# Patient Record
Sex: Male | Born: 1940 | Race: White | Hispanic: Refuse to answer | Smoking: Never smoker
Health system: Southern US, Community
[De-identification: ages and names within clinical notes are randomized; demographics above are authoritative.]

## PROBLEM LIST (undated history)

## (undated) DIAGNOSIS — R519 Headache, unspecified: Secondary | ICD-10-CM

## (undated) DIAGNOSIS — Z85828 Personal history of other malignant neoplasm of skin: Secondary | ICD-10-CM

## (undated) DIAGNOSIS — M1990 Unspecified osteoarthritis, unspecified site: Secondary | ICD-10-CM

## (undated) DIAGNOSIS — C801 Malignant (primary) neoplasm, unspecified: Secondary | ICD-10-CM

## (undated) DIAGNOSIS — N400 Enlarged prostate without lower urinary tract symptoms: Secondary | ICD-10-CM

## (undated) DIAGNOSIS — G4700 Insomnia, unspecified: Secondary | ICD-10-CM

## (undated) DIAGNOSIS — G43909 Migraine, unspecified, not intractable, without status migrainosus: Secondary | ICD-10-CM

## (undated) DIAGNOSIS — F418 Other specified anxiety disorders: Secondary | ICD-10-CM

## (undated) DIAGNOSIS — I454 Nonspecific intraventricular block: Secondary | ICD-10-CM

## (undated) DIAGNOSIS — E785 Hyperlipidemia, unspecified: Secondary | ICD-10-CM

## (undated) DIAGNOSIS — K5730 Diverticulosis of large intestine without perforation or abscess without bleeding: Secondary | ICD-10-CM

## (undated) DIAGNOSIS — R51 Headache: Secondary | ICD-10-CM

## (undated) DIAGNOSIS — F325 Major depressive disorder, single episode, in full remission: Secondary | ICD-10-CM

## (undated) DIAGNOSIS — F329 Major depressive disorder, single episode, unspecified: Secondary | ICD-10-CM

## (undated) DIAGNOSIS — H269 Unspecified cataract: Secondary | ICD-10-CM

## (undated) DIAGNOSIS — K4090 Unilateral inguinal hernia, without obstruction or gangrene, not specified as recurrent: Secondary | ICD-10-CM

## (undated) DIAGNOSIS — K219 Gastro-esophageal reflux disease without esophagitis: Secondary | ICD-10-CM

## (undated) DIAGNOSIS — K2100 Gastro-esophageal reflux disease with esophagitis, without bleeding: Secondary | ICD-10-CM

## (undated) DIAGNOSIS — I10 Essential (primary) hypertension: Secondary | ICD-10-CM

## (undated) DIAGNOSIS — N200 Calculus of kidney: Secondary | ICD-10-CM

## (undated) DIAGNOSIS — F419 Anxiety disorder, unspecified: Secondary | ICD-10-CM

## (undated) DIAGNOSIS — T7840XA Allergy, unspecified, initial encounter: Secondary | ICD-10-CM

## (undated) DIAGNOSIS — N401 Enlarged prostate with lower urinary tract symptoms: Secondary | ICD-10-CM

## (undated) HISTORY — DX: Calculus of kidney: N20.0

## (undated) HISTORY — DX: Insomnia, unspecified: G47.00

## (undated) HISTORY — DX: Unilateral inguinal hernia, without obstruction or gangrene, not specified as recurrent: K40.90

## (undated) HISTORY — DX: Allergy, unspecified, initial encounter: T78.40XA

## (undated) HISTORY — DX: Malignant (primary) neoplasm, unspecified: C80.1

## (undated) HISTORY — DX: Other specified anxiety disorders: F41.8

## (undated) HISTORY — DX: Diverticulosis of large intestine without perforation or abscess without bleeding: K57.30

## (undated) HISTORY — DX: Nonspecific intraventricular block: I45.4

## (undated) HISTORY — DX: Gastro-esophageal reflux disease without esophagitis: K21.9

## (undated) HISTORY — DX: Headache: R51

## (undated) HISTORY — PX: COLONOSCOPY W/ POLYPECTOMY: SHX1380

## (undated) HISTORY — PX: WISDOM TOOTH EXTRACTION: SHX21

## (undated) HISTORY — DX: Essential (primary) hypertension: I10

## (undated) HISTORY — DX: Headache, unspecified: R51.9

## (undated) HISTORY — DX: Personal history of other malignant neoplasm of skin: Z85.828

## (undated) HISTORY — DX: Unspecified cataract: H26.9

## (undated) HISTORY — PX: TONSILLECTOMY: SUR1361

## (undated) HISTORY — DX: Anxiety disorder, unspecified: F41.9

## (undated) HISTORY — DX: Migraine, unspecified, not intractable, without status migrainosus: G43.909

## (undated) HISTORY — DX: Major depressive disorder, single episode, unspecified: F32.9

## (undated) HISTORY — DX: Gastro-esophageal reflux disease with esophagitis, without bleeding: K21.00

## (undated) HISTORY — DX: Benign prostatic hyperplasia with lower urinary tract symptoms: N40.1

## (undated) HISTORY — PX: HERNIA REPAIR: SHX51

## (undated) HISTORY — DX: Hyperlipidemia, unspecified: E78.5

## (undated) HISTORY — PX: MOHS SURGERY: SUR867

## (undated) HISTORY — PX: INGUINAL HERNIA REPAIR: SUR1180

## (undated) HISTORY — DX: Unspecified osteoarthritis, unspecified site: M19.90

## (undated) HISTORY — DX: Benign prostatic hyperplasia without lower urinary tract symptoms: N40.0

## (undated) HISTORY — DX: Major depressive disorder, single episode, in full remission: F32.5

---

## 2014-09-12 HISTORY — PX: COLONOSCOPY W/ POLYPECTOMY: SHX1380

## 2016-03-14 HISTORY — PX: MOHS SURGERY: SUR867

## 2016-06-21 ENCOUNTER — Encounter: Payer: Self-pay | Admitting: *Deleted

## 2016-07-07 ENCOUNTER — Ambulatory Visit (INDEPENDENT_AMBULATORY_CARE_PROVIDER_SITE_OTHER): Payer: Medicare Other | Admitting: Cardiology

## 2016-07-07 ENCOUNTER — Encounter (INDEPENDENT_AMBULATORY_CARE_PROVIDER_SITE_OTHER): Payer: Self-pay

## 2016-07-07 ENCOUNTER — Encounter: Payer: Self-pay | Admitting: Cardiology

## 2016-07-07 VITALS — BP 122/80 | HR 84 | Ht 72.0 in | Wt 173.5 lb

## 2016-07-07 DIAGNOSIS — I454 Nonspecific intraventricular block: Secondary | ICD-10-CM

## 2016-07-07 DIAGNOSIS — R011 Cardiac murmur, unspecified: Secondary | ICD-10-CM

## 2016-07-07 DIAGNOSIS — E7800 Pure hypercholesterolemia, unspecified: Secondary | ICD-10-CM | POA: Diagnosis not present

## 2016-07-07 NOTE — Progress Notes (Signed)
Cardiology Office Note:    Date:  07/07/2016   ID:  Peter Best, DOB Jun 29, 1940, MRN [id number]  PCP:  Pcp Not In System  Cardiologist:  Donato Schultz, MD     Referring MD: No ref. provider found    History of Present Illness:    Peter Best is a 76 y.o. male here as a self-referral because he wanted to be evaluated by cardiologist. I have reviewed his notes from Dr. Allena Katz internal medicine. He has been doing quite well. Has anxiety, hyperlipidemia, hypertensive disorder. Occasionally has blood pressure will be a little bit higher than he would like. He has been on Zoloft in the past for anxiety. He has been on atorvastatin 20 mg and lisinopril 10 mg as well as aspirin.  At times when he checks his blood pressures at home, his machine may register irregular heartbeat. Sometimes he may feel something slightly different but usually he does not feel any significant palpitations.  Rheumatic fever as a child.   Peter Best here today.   He worked in Radio producer previously. Retired. Enjoying [employer] [facility].  Past Medical History:  Diagnosis Date  . Anxiety disorder   . Benign prostatic hyperplasia   . Gastroesophageal reflux disease   . Hyperlipidemia   . Hypertensive disorder   . Inguinal hernia   . Insomnia   . Major depressive disorder   . Osteoarthritis    OF THE HIP    Past Surgical History:  Procedure Laterality Date  . COLONOSCOPY W/ POLYPECTOMY    . COLONOSCOPY W/ POLYPECTOMY     UNCODED SURGICAL HISTORY  . HERNIA REPAIR    . INGUINAL HERNIA REPAIR    . MOHS SURGERY    . MOHS SURGERY     MOHS' CHEMOSURGERY PROCEDURE DATE: 09/30/2010:BCC R INFERIOR TEMPLE, UNCODED SURGICAL HISTORY    Current Medications: Current Meds  Medication Sig  . atorvastatin (LIPITOR) 20 MG tablet Take 20 mg by mouth daily.  Marland Kitchen lisinopril (PRINIVIL,ZESTRIL) 20 MG tablet Take 20 mg by mouth daily.  Marland Kitchen omeprazole (PRILOSEC) 20 MG capsule Take 20 mg by mouth daily.     Allergies:   Patient has no allergy information on record.   Social History   Social History  . Marital status: Single    Spouse name: N/A  . Number of children: N/A  . Years of education: N/A   Social History Main Topics  . Smoking status: Not on file  . Smokeless tobacco: Not on file  . Alcohol use Not on file  . Drug use: No  . Sexual activity: Not on file   Other Topics Concern  . Not on file   Social History Narrative  . No narrative on file     No early family history of coronary artery disease. Mother died CVA later in life.   ROS:   Please see the history of present illness.   Denies syncope, bleeding, orthopnea, PND.  All other systems reviewed and are negative.   EKGs/Labs/Other Studies Reviewed:    EKG:  EKG is  ordered today.  The ekg ordered today demonstrates 07/07/16-sinus rhythm 84 with right bundle branch block personally viewed.  Recent Labs: No results found for requested labs within last 8760 hours.   Recent Lipid Panel No results found for: CHOL, TRIG, HDL, CHOLHDL, VLDL, LDLCALC, LDLDIRECT  Physical Exam:    VS:  BP 122/80   Pulse 84   Ht 6' (1.829 m)   Wt 173 lb 8 oz (78.7 kg)  SpO2 97%   BMI 23.53 kg/m     Wt Readings from Last 3 Encounters:  07/07/16 173 lb 8 oz (78.7 kg)     GEN:  Well nourished, well developed in no acute distress HEENT: Normal NECK: No JVD; No carotid bruits LYMPHATICS: No lymphadenopathy CARDIAC: RRR, 1/6 systolic murmur right upper sternal border, no rubs, gallops RESPIRATORY:  Clear to auscultation without rales, wheezing or rhonchi  ABDOMEN: Soft, non-tender, non-distended MUSCULOSKELETAL:  No edema; No deformity  SKIN: Warm and dry, excellent distal pulses. NEUROLOGIC:  Alert and oriented x 3 PSYCHIATRIC:  Normal affect   ASSESSMENT:    1. Murmur, cardiac   2. BBB (bundle branch block)   3. Pure hypercholesterolemia    PLAN:    In order of problems listed above:  Right bundle branch  block  - Stable, no high-risk symptoms such as syncope.  Possible irregular heartbeat  - Sometimes seen on blood pressure monitor at home. This may be artifactual. He does not feel any significant palpitations. No other high-risk symptoms.  Hypertension  - On lisinopril 20 mg. Doing well. No changes made. Excellent control by Dr. Allena Katz.  Hyperlipidemia  - On atorvastatin 20 mg doing well. No myalgias.  Heart murmur  - With his history of rheumatic fever as a child and soft systolic heart murmur at right upper sternal border, I will check an echocardiogram.  We will send note to Dr. Allena Katz, telephone number [phone], located in [place].  Medication Adjustments/Labs and Tests Ordered: Current medicines are reviewed at length with the patient today.  Concerns regarding medicines are outlined above. Labs and tests ordered and medication changes are outlined in the patient instructions below:  Patient Instructions  Medication Instructions:  The current medical regimen is effective;  continue present plan and medications.  Testing/Procedures: Your physician has requested that you have an echocardiogram. Echocardiography is a painless test that uses sound waves to create images of your heart. It provides your doctor with information about the size and shape of your heart and how well your heart's chambers and valves are working. This procedure takes approximately one hour. There are no restrictions for this procedure.  Follow-Up: Follow up in 2 years with Dr. Anne Fu.  You will receive a letter in the mail 2 months before you are due.  Please call us when you receive this letter to schedule your follow up appointment.  If you need a refill on your cardiac medications before your next appointment, please call your pharmacy.  Thank you for choosing [facility]!!        Signed, Donato Schultz, MD  07/07/2016 11:03 AM    [facility]

## 2016-07-07 NOTE — Patient Instructions (Signed)
Medication Instructions:  The current medical regimen is effective;  continue present plan and medications.  Testing/Procedures: Your physician has requested that you have an echocardiogram. Echocardiography is a painless test that uses sound waves to create images of your heart. It provides your doctor with information about the size and shape of your heart and how well your heart's chambers and valves are working. This procedure takes approximately one hour. There are no restrictions for this procedure.  Follow-Up: Follow up in 2 years with Dr. Marlou Porch.  You will receive a letter in the mail 2 months before you are due.  Please call us when you receive this letter to schedule your follow up appointment.  If you need a refill on your cardiac medications before your next appointment, please call your pharmacy.  Thank you for choosing [facility]!!

## 2016-07-25 ENCOUNTER — Ambulatory Visit (HOSPITAL_COMMUNITY): Payer: Medicare Other | Attending: Cardiology

## 2016-07-25 ENCOUNTER — Other Ambulatory Visit: Payer: Self-pay

## 2016-07-25 DIAGNOSIS — I454 Nonspecific intraventricular block: Secondary | ICD-10-CM | POA: Diagnosis not present

## 2016-07-25 DIAGNOSIS — I351 Nonrheumatic aortic (valve) insufficiency: Secondary | ICD-10-CM | POA: Insufficient documentation

## 2016-07-25 DIAGNOSIS — R011 Cardiac murmur, unspecified: Secondary | ICD-10-CM | POA: Diagnosis present

## 2016-07-25 LAB — ECHOCARDIOGRAM COMPLETE
Ao-asc: 35 cm
CHL CUP LV S' LATERAL: 11.5 cm/s
CHL CUP MV DEC (S): 194
CHL CUP STROKE VOLUME: 56 mL
CHL CUP TV REG PEAK VELOCITY: 204 cm/s
E/e' ratio: 9.42
EWDT: 194 ms
FS: 31 % (ref 28–44)
IV/PV OW: 1.02
LA diam end sys: 36 mm
LA diam index: 1.79 cm/m2
LA vol: 49 mL
LASIZE: 36 mm
LAVOLA4C: 48 mL
LAVOLIN: 24.4 mL/m2
LDCA: 3.46 cm2
LV PW d: 12.1 mm — AB (ref 0.6–1.1)
LV TDI E'MEDIAL: 5.46
LV dias vol index: 41 mL/m2
LV e' LATERAL: 7.12 cm/s
LV sys vol: 27 mL (ref 21–61)
LVDIAVOL: 83 mL (ref 62–150)
LVEEAVG: 9.42
LVEEMED: 9.42
LVOT VTI: 20.7 cm
LVOT peak grad rest: 5 mmHg
LVOTD: 21 mm
LVOTPV: 111 cm/s
LVOTSV: 72 mL
LVSYSVOLIN: 13 mL/m2
MV pk A vel: 80 m/s
MV pk E vel: 67.1 m/s
P 1/2 time: 513 ms
Simpson's disk: 67
TDI e' lateral: 7.12
TRMAXVEL: 204 cm/s

## 2017-07-19 DIAGNOSIS — M25562 Pain in left knee: Secondary | ICD-10-CM | POA: Insufficient documentation

## 2017-07-19 DIAGNOSIS — M79652 Pain in left thigh: Secondary | ICD-10-CM

## 2017-07-19 HISTORY — DX: Pain in left knee: M25.562

## 2017-07-19 HISTORY — DX: Pain in left thigh: M79.652

## 2017-10-05 ENCOUNTER — Telehealth: Payer: Self-pay | Admitting: Internal Medicine

## 2017-10-26 ENCOUNTER — Telehealth: Payer: Self-pay | Admitting: Internal Medicine

## 2017-10-26 NOTE — Telephone Encounter (Signed)
Outside records from [place] reviewed. Colonoscopy performed 09/25/2014. The patient had one 1,1 cm flat polyp at the ileocecal valve and 5 mm cecal polyp removed. Found to be adenomatous. Follow-up in 3 years recommended. Okay to schedule for colonoscopy with me in the [facility]

## 2017-10-27 ENCOUNTER — Encounter: Payer: Self-pay | Admitting: Internal Medicine

## 2017-11-07 NOTE — Telephone Encounter (Signed)
Pt is scheduled for 01-02-18 for a colon with Dr. Henrene Pastor

## 2017-12-07 ENCOUNTER — Encounter: Payer: Self-pay | Admitting: Internal Medicine

## 2017-12-14 ENCOUNTER — Ambulatory Visit (AMBULATORY_SURGERY_CENTER): Payer: Self-pay | Admitting: *Deleted

## 2017-12-14 ENCOUNTER — Other Ambulatory Visit: Payer: Self-pay

## 2017-12-14 VITALS — Ht 69.0 in | Wt 172.0 lb

## 2017-12-14 DIAGNOSIS — Z86010 Personal history of colonic polyps: Secondary | ICD-10-CM

## 2017-12-14 MED ORDER — SUPREP BOWEL PREP KIT 17.5-3.13-1.6 GM/177ML PO SOLN: 1.0000 | Freq: Once | ORAL | 0 refills | Status: AC

## 2017-12-14 NOTE — Progress Notes (Signed)
Patient denies any allergies to egg or soy products. Patient denies complications with anesthesia/sedation.  Patient denies oxygen use at home and denies diet medications. Pamphlet given to patient on colonoscopy.

## 2017-12-21 ENCOUNTER — Encounter: Payer: Medicare Other | Admitting: Internal Medicine

## 2018-01-01 DIAGNOSIS — H2513 Age-related nuclear cataract, bilateral: Secondary | ICD-10-CM

## 2018-01-01 HISTORY — DX: Age-related nuclear cataract, bilateral: H25.13

## 2018-01-02 ENCOUNTER — Encounter: Payer: Self-pay | Admitting: Internal Medicine

## 2018-01-02 ENCOUNTER — Ambulatory Visit (AMBULATORY_SURGERY_CENTER): Payer: Medicare Other | Admitting: Internal Medicine

## 2018-01-02 VITALS — BP 122/62 | HR 83 | Temp 97.8°F | Resp 17 | Ht 69.0 in | Wt 172.0 lb

## 2018-01-02 DIAGNOSIS — Z86010 Personal history of colonic polyps: Secondary | ICD-10-CM

## 2018-01-02 MED ORDER — SODIUM CHLORIDE 0.9 % IV SOLN: 500.0000 mL | Freq: Once | INTRAVENOUS | Status: AC

## 2018-01-02 NOTE — Patient Instructions (Signed)
YOU HAD AN ENDOSCOPIC PROCEDURE TODAY AT THE [facility]:   Refer to the procedure report that was given to you for any specific questions about what was found during the examination.  If the procedure report does not answer your questions, please call your gastroenterologist to clarify.  If you requested that your care partner not be given the details of your procedure findings, then the procedure report has been included in a sealed envelope for you to review at your convenience later.  YOU SHOULD EXPECT: Some feelings of bloating in the abdomen. Passage of more gas than usual.  Walking can help get rid of the air that was put into your GI tract during the procedure and reduce the bloating. If you had a lower endoscopy (such as a colonoscopy or flexible sigmoidoscopy) you may notice spotting of blood in your stool or on the toilet paper. If you underwent a bowel prep for your procedure, you may not have a normal bowel movement for a few days.  Please Note:  You might notice some irritation and congestion in your nose or some drainage.  This is from the oxygen used during your procedure.  There is no need for concern and it should clear up in a day or so.  SYMPTOMS TO REPORT IMMEDIATELY:   Following lower endoscopy (colonoscopy or flexible sigmoidoscopy):  Excessive amounts of blood in the stool  Significant tenderness or worsening of abdominal pains  Swelling of the abdomen that is new, acute  Fever of 100F or higher  For urgent or emergent issues, a gastroenterologist can be reached at any hour by calling [phone].   DIET:  We do recommend a small meal at first, but then you may proceed to your regular diet.  Drink plenty of fluids but you should avoid alcoholic beverages for 24 hours.  MEDICATIONS: Continue present medications.  Please see handouts given to you by your recovery nurse.  ACTIVITY:  You should plan to take it easy for the rest of today and you should NOT  DRIVE or use heavy machinery until tomorrow (because of the sedation medicines used during the test).    FOLLOW UP: Our staff will call the number listed on your records the next business day following your procedure to check on you and address any questions or concerns that you may have regarding the information given to you following your procedure. If we do not reach you, we will leave a message.  However, if you are feeling well and you are not experiencing any problems, there is no need to return our call.  We will assume that you have returned to your regular daily activities without incident.  If any biopsies were taken you will be contacted by phone or by letter within the next 1-3 weeks.  Please call us at [phone] if you have not heard about the biopsies in 3 weeks.   Thank you for allowing us to provide for your healthcare needs today.   SIGNATURES/CONFIDENTIALITY: You and/or your care partner have signed paperwork which will be entered into your electronic medical record.  These signatures attest to the fact that that the information above on your After Visit Summary has been reviewed and is understood.  Full responsibility of the confidentiality of this discharge information lies with you and/or your care-partner. 

## 2018-01-02 NOTE — Op Note (Signed)
[facility] Patient Name: Peter Best Procedure Date: 01/02/2018 4:20 PM MRN: [id number] Endoscopist: Wilhemina Bonito. Marina Goodell , MD Age: 77 Referring MD:  Date of Birth: 18-Aug-1940 Gender: Male Account #: [id number] Procedure:                Colonoscopy Indications:              High risk colon cancer surveillance: Personal                            history of adenoma (10 mm or greater in size).                            Prior exam in [place] September 25, 2014 Medicines:                Monitored Anesthesia Care Procedure:                Pre-Anesthesia Assessment:                           - Prior to the procedure, a History and Physical                            was performed, and patient medications and                            allergies were reviewed. The patient's tolerance of                            previous anesthesia was also reviewed. The risks                            and benefits of the procedure and the sedation                            options and risks were discussed with the patient.                            All questions were answered, and informed consent                            was obtained. Prior Anticoagulants: The patient has                            taken no previous anticoagulant or antiplatelet                            agents. ASA Grade Assessment: II - A patient with                            mild systemic disease. After reviewing the risks                            and benefits, the patient was deemed in  satisfactory condition to undergo the procedure.                           After obtaining informed consent, the colonoscope                            was passed under direct vision. Throughout the                            procedure, the patient's blood pressure, pulse, and                            oxygen saturations were monitored continuously. The                            Colonoscope was introduced  through the anus and                            advanced to the the cecum, identified by                            appendiceal orifice and ileocecal valve. The                            terminal ileum, ileocecal valve, appendiceal                            orifice, and rectum were photographed. The quality                            of the bowel preparation was excellent. The                            colonoscopy was performed without difficulty. The                            patient tolerated the procedure well. The bowel                            preparation used was SUPREP. Scope In: 4:33:49 PM Scope Out: 4:49:14 PM Scope Withdrawal Time: 0 hours 11 minutes 51 seconds  Total Procedure Duration: 0 hours 15 minutes 25 seconds  Findings:                 Multiple diverticula were found in the entire colon.                           Internal hemorrhoids were found during                            retroflexion. The hemorrhoids were small.                           The exam was otherwise without abnormality on  direct and retroflexion views. Complications:            No immediate complications. Estimated blood loss:                            None. Estimated Blood Loss:     Estimated blood loss: none. Impression:               - Diverticulosis in the entire examined colon.                           - Internal hemorrhoids.                           - The examination was otherwise normal on direct                            and retroflexion views.                           - No specimens collected. Recommendation:           - Repeat colonoscopy is not recommended for                            surveillance.                           - Patient has a contact number available for                            emergencies. The signs and symptoms of potential                            delayed complications were discussed with the                            patient.  Return to normal activities tomorrow.                            Written discharge instructions were provided to the                            patient.                           - Resume previous diet.                           - Continue present medications. Wilhemina Bonito. Marina Goodell, MD 01/02/2018 4:54:03 PM This report has been signed electronically.

## 2018-01-02 NOTE — Progress Notes (Signed)
Pt's states no medical or surgical changes since previsit or office visit. 

## 2018-01-02 NOTE — Progress Notes (Signed)
A/ox3 pleased with MAC, report to RN 

## 2018-01-03 ENCOUNTER — Telehealth: Payer: Self-pay | Admitting: *Deleted

## 2018-01-03 NOTE — Telephone Encounter (Signed)
  Follow up Call-  Call back number 01/02/2018  Post procedure Call Back phone  # [phone]  Permission to leave phone message Yes     Patient questions:  Do you have a fever, pain , or abdominal swelling? No. Pain Score  0 *  Have you tolerated food without any problems? Yes.    Have you been able to return to your normal activities? Yes.    Do you have any questions about your discharge instructions: Diet   No. Medications  No. Follow up visit  No.  Do you have questions or concerns about your Care? No.  Actions: * If pain score is 4 or above: No action needed, pain <4.

## 2018-03-23 NOTE — Telephone Encounter (Signed)
Done

## 2018-04-10 DIAGNOSIS — R69 Illness, unspecified: Secondary | ICD-10-CM | POA: Diagnosis not present

## 2018-04-18 DIAGNOSIS — Z823 Family history of stroke: Secondary | ICD-10-CM | POA: Diagnosis not present

## 2018-04-18 DIAGNOSIS — M542 Cervicalgia: Secondary | ICD-10-CM | POA: Diagnosis not present

## 2018-04-18 DIAGNOSIS — R69 Illness, unspecified: Secondary | ICD-10-CM | POA: Diagnosis not present

## 2018-04-18 DIAGNOSIS — R51 Headache: Secondary | ICD-10-CM | POA: Diagnosis not present

## 2018-04-18 DIAGNOSIS — K219 Gastro-esophageal reflux disease without esophagitis: Secondary | ICD-10-CM | POA: Diagnosis not present

## 2018-04-18 DIAGNOSIS — J309 Allergic rhinitis, unspecified: Secondary | ICD-10-CM | POA: Diagnosis not present

## 2018-04-18 DIAGNOSIS — I10 Essential (primary) hypertension: Secondary | ICD-10-CM | POA: Diagnosis not present

## 2018-04-18 DIAGNOSIS — E785 Hyperlipidemia, unspecified: Secondary | ICD-10-CM | POA: Diagnosis not present

## 2018-04-18 DIAGNOSIS — Z7951 Long term (current) use of inhaled steroids: Secondary | ICD-10-CM | POA: Diagnosis not present

## 2018-06-18 ENCOUNTER — Telehealth: Payer: Self-pay | Admitting: *Deleted

## 2018-06-18 NOTE — Telephone Encounter (Signed)
Left message for pt to c/b to discuss the upcoming appointment.  Need to determine if he is comfortable with a virtual visit through [employer] and if so send information and instructions through [place].

## 2018-06-19 NOTE — Telephone Encounter (Signed)
Spoke with patient who does not have a smart phone and prefers to reschedule to a later date.  He is not having any cardiac issues and just wants to reestablish.  appt rescheduled until 7/13.  He is aware to c/b if problems prior to then.

## 2018-06-27 ENCOUNTER — Ambulatory Visit: Payer: Medicare HMO | Admitting: Cardiology

## 2018-09-13 ENCOUNTER — Telehealth: Payer: Self-pay

## 2018-09-13 NOTE — Telephone Encounter (Signed)
lpmtcb 7/2

## 2018-09-13 NOTE — Telephone Encounter (Signed)
Patient returned your call.

## 2018-09-13 NOTE — Telephone Encounter (Signed)
I spoke to the patient who is concerned about his elevating BP since the start of the Virus and lock down at his facility.  He has been under a lot of stress and his BP has been fluctuating, sent through My Chart.  He has an occasional headache, but otherwise asymptomatic.  He looks forward to further advisement at his OV 7/13 with Dr Marlou Porch.

## 2018-09-18 DIAGNOSIS — R69 Illness, unspecified: Secondary | ICD-10-CM | POA: Diagnosis not present

## 2018-09-21 ENCOUNTER — Encounter: Payer: Self-pay | Admitting: Cardiology

## 2018-09-21 ENCOUNTER — Telehealth: Payer: Self-pay | Admitting: Cardiology

## 2018-09-21 ENCOUNTER — Telehealth: Payer: Self-pay

## 2018-09-21 NOTE — Telephone Encounter (Signed)
Left VM to call back to confirm his Phone call appointment.

## 2018-09-21 NOTE — Telephone Encounter (Signed)
New Message    Left message to  Confirm appt and answer COVID questions

## 2018-09-21 NOTE — Telephone Encounter (Signed)

## 2018-09-24 ENCOUNTER — Ambulatory Visit: Payer: Medicare HMO | Admitting: Cardiology

## 2018-09-24 NOTE — Progress Notes (Deleted)
{Choose 1 Note Type (Telehealth Visit or Telephone Visit):[phone]}   Date:  09/24/2018   ID:  Peter Best, DOB [birth year], MRN [id number]  {Patient Location:[phone]::"Home"} {Provider Location:[phone]::"Home"}  PCP:  System, Pcp Not In  Cardiologist:  No primary care provider on file. *** Electrophysiologist:  None   Evaluation Performed:  {Choose Visit Type:[phone]::"Follow-Up Visit"}  Chief Complaint: Here for follow-up, hyperlipidemia  History of Present Illness:    Peter Best is a 78 y.o. male with prior office visit 07/07/2016, self-referral wanting to be evaluated.  Doing well.  Had anxiety hyperlipidemia hypertension occasionally blood pressure little bit higher than what he would want to be.  Zoloft in the past for anxiety.  Atorvastatin 20 mg and lisinopril 10 mg for blood pressure.  Sometimes he may feel some palpitations, blood pressure cuff may register irregular heartbeats previously at home he documented.  States that he had rheumatic fever as a child.  Used to work in radio and television previously.  Retired.  The patient {does/does not:200015} have symptoms concerning for COVID-19 infection (fever, chills, cough, or new shortness of breath).    Past Medical History:  Diagnosis Date  . Allergy   . Anxiety disorder   . BBB (bundle branch block)   . Benign prostatic hyperplasia   . 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 Cataract    just watching - bilateral  . Gastroesophageal reflux disease   . HA (headache)    otc med prn  . Hyperlipidemia   . Hypertensive disorder   . Inguinal hernia    repaired by surgery  . Insomnia   . Kidney stones    passed stone - no surgery required  . Major depressive disorder   . Osteoarthritis    OF THE HIP and mid back   Past Surgical History:  Procedure Laterality Date  . COLONOSCOPY W/ POLYPECTOMY  09/2014   polyps  - done in [place]  . COLONOSCOPY W/ POLYPECTOMY     UNCODED SURGICAL HISTORY  .  HERNIA REPAIR    . INGUINAL HERNIA REPAIR    . MOHS SURGERY  2018   x x  on nose  . MOHS SURGERY     MOHS' CHEMOSURGERY PROCEDURE DATE: 09/30/2010:BCC R INFERIOR TEMPLE, UNCODED SURGICAL HISTORY  . TONSILLECTOMY    . WISDOM TOOTH EXTRACTION       No outpatient medications have been marked as taking for the 09/24/18 encounter (Appointment) with Jake Bathe, MD.   Current Facility-Administered Medications for the 09/24/18 encounter (Appointment) with Jake Bathe, MD  Medication  . 0.9 %  sodium chloride infusion     Allergies:   Patient has no known allergies.   Social History   Tobacco Use  . Smoking status: Never Smoker  . Smokeless tobacco: Never Used  Substance Use Topics  . Alcohol use: Never    Frequency: Never  . Drug use: No     Family Hx: The patient's family history is negative for Colon polyps, Rectal cancer, and Stomach cancer.  Mother died of a stroke later in her life.  No early family history of CAD  ROS:   Please see the history of present illness.    *** All other systems reviewed and are negative.   Prior CV studies:   The following studies were reviewed today:  Echocardiogram 07/25/2016: - Left ventricle: The cavity size was normal. There was mild   concentric hypertrophy. Systolic function was normal. The  estimated ejection fraction was in the range of 55% to 60%. Wall   motion was normal; there were no regional wall motion   abnormalities. Doppler parameters are consistent with abnormal   left ventricular relaxation (grade 1 diastolic dysfunction).   There was no evidence of elevated ventricular filling pressure by   Doppler parameters. - Aortic valve: Trileaflet; moderately thickened, moderately   calcified leaflets. There was mild regurgitation. - Aortic root: The aortic root was normal in size. - Mitral valve: There was no regurgitation. - Tricuspid valve: There was no regurgitation. - Pulmonic valve: There was mild regurgitation. -  Pulmonary arteries: Systolic pressure was within the normal   range. - Inferior vena cava: The vessel was normal in size. - Pericardium, extracardiac: There was no pericardial effusion.  Labs/Other Tests and Data Reviewed:    EKG:  {EKG/Telemetry Strips Reviewed:[phone]} EKG on 07/07/2016 showed sinus rhythm 84 with right bundle branch block.  Recent Labs: No results found for requested labs within last 8760 hours.   Recent Lipid Panel No results found for: CHOL, TRIG, HDL, CHOLHDL, LDLCALC, LDLDIRECT  Wt Readings from Last 3 Encounters:  01/02/18 172 lb (78 kg)  12/14/17 172 lb (78 kg)  07/07/16 173 lb 8 oz (78.7 kg)     Objective:    Vital Signs:  There were no vitals taken for this visit.   {[facility] Virtual Exam (Optional):[phone]::"VITAL SIGNS:  reviewed"}  ASSESSMENT & PLAN:    Right bundle branch block - Asymptomatic.  Should be of no clinical consequence.  No high risk symptoms such as syncope.  Essential hypertension -On lisinopril 20.  Doing well.  Hyperlipidemia -Atorvastatin 20.  Doing well without any myalgias.  Previous heart murmur -Soft systolic heart murmur-echocardiogram actually showed some thickening of the aortic valve with mild aortic regurgitation.  COVID-19 Education: The signs and symptoms of COVID-19 were discussed with the patient and how to seek care for testing (follow up with PCP or arrange E-visit).  ***The importance of social distancing was discussed today.  Time:   Today, I have spent *** minutes with the patient with telehealth technology discussing the above problems.     Medication Adjustments/Labs and Tests Ordered: Current medicines are reviewed at length with the patient today.  Concerns regarding medicines are outlined above.   Tests Ordered: No orders of the defined types were placed in this encounter.   Medication Changes: No orders of the defined types were placed in this encounter.   Follow Up:  {F/U  Format:[phone]} {follow up:15908}  Signed, Donato Schultz, MD  09/24/2018 8:37 AM    [facility]

## 2018-09-28 ENCOUNTER — Telehealth: Payer: Self-pay | Admitting: Cardiology

## 2018-09-28 NOTE — Telephone Encounter (Signed)
New Message ° ° ° °Left message to confirm appt and answer covid questions  °

## 2018-09-28 NOTE — Telephone Encounter (Signed)

## 2018-10-01 ENCOUNTER — Encounter: Payer: Self-pay | Admitting: Cardiology

## 2018-10-01 ENCOUNTER — Other Ambulatory Visit: Payer: Self-pay

## 2018-10-01 ENCOUNTER — Ambulatory Visit (INDEPENDENT_AMBULATORY_CARE_PROVIDER_SITE_OTHER): Payer: Medicare HMO | Admitting: Cardiology

## 2018-10-01 VITALS — BP 120/80 | HR 80 | Ht 69.0 in | Wt 172.4 lb

## 2018-10-01 DIAGNOSIS — I4510 Unspecified right bundle-branch block: Secondary | ICD-10-CM | POA: Diagnosis not present

## 2018-10-01 DIAGNOSIS — E7800 Pure hypercholesterolemia, unspecified: Secondary | ICD-10-CM | POA: Insufficient documentation

## 2018-10-01 DIAGNOSIS — E781 Pure hyperglyceridemia: Secondary | ICD-10-CM

## 2018-10-01 DIAGNOSIS — I10 Essential (primary) hypertension: Secondary | ICD-10-CM | POA: Insufficient documentation

## 2018-10-01 HISTORY — DX: Pure hypercholesterolemia, unspecified: E78.00

## 2018-10-01 HISTORY — DX: Essential (primary) hypertension: I10

## 2018-10-01 HISTORY — DX: Unspecified right bundle-branch block: I45.10

## 2018-10-01 NOTE — Progress Notes (Signed)
Cardiology Office Note:    Date:  10/01/2018   ID:  Peter Best, DOB [birth year], MRN [id number]  PCP:  System, Pcp Not In  Cardiologist:  Donato Schultz, MD  Electrophysiologist:  None   Referring MD: No ref. provider found   Blood pressure follow-up  History of Present Illness:    Peter Best is a 78 y.o. male with a hx of self-referral previously because of hyperlipidemia, hypertension seen last on 07/07/2016.  He had been on Zoloft in the past for anxiety.  Atorvastatin 20 mg and lisinopril 10 mg.  Aspirin.  He was worried previously that his machine was registering irregular heartbeat sometimes at home no real significant palpitations.  He had rheumatic fever as a child.  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  Enjoyed retirement community.  BP up and down. Lisinopril 20 to 10.   Patient message on 09/13/2018 with some concerns over blood pressure.  1 day it was 155/67 and then the next day 118/70.  Heart rate normal.  Not on MVI. Is ok. Good diet. Off ASA.   Yawn and HA. Naps. [employer].   Past Medical History:  Diagnosis Date  . Allergy   . Anxiety disorder   . BBB (bundle branch block)   . Benign prostatic hyperplasia   . 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 Cataract    just watching - bilateral  . Gastroesophageal reflux disease   . HA (headache)    otc med prn  . Hyperlipidemia   . Hypertensive disorder   . Inguinal hernia    repaired by surgery  . Insomnia   . Kidney stones    passed stone - no surgery required  . Major depressive disorder   . 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 COLONOSCOPY W/ POLYPECTOMY  09/2014   polyps  - done in [place]  . COLONOSCOPY W/ POLYPECTOMY     UNCODED SURGICAL HISTORY  . HERNIA REPAIR    . INGUINAL HERNIA REPAIR    . MOHS SURGERY  2018   x x  on nose  . MOHS SURGERY     MOHS' CHEMOSURGERY PROCEDURE DATE: 09/30/2010:BCC R  INFERIOR TEMPLE, UNCODED SURGICAL HISTORY  . TONSILLECTOMY    . WISDOM TOOTH EXTRACTION      Current Medications: Current Meds  Medication Sig  . acetaminophen (TYLENOL) 500 MG tablet Take 1,000 mg by mouth every 6 (six) hours as needed for mild pain.  Marland Kitchen ibuprofen (ADVIL,MOTRIN) 400 MG tablet Take 400 mg by mouth every 6 (six) hours as needed for mild pain.  Marland Kitchen lisinopril (ZESTRIL) 10 MG tablet Take 10 mg by mouth daily.  Marland Kitchen omeprazole (PRILOSEC) 20 MG capsule Take 20 mg by mouth daily.  . sertraline (ZOLOFT) 50 MG tablet sertraline 50 mg tablet  . [DISCONTINUED] lisinopril (PRINIVIL,ZESTRIL) 20 MG tablet Take 20 mg by mouth daily.   Current Facility-Administered Medications for the 10/01/18 encounter (Office Visit) with Jake Bathe, MD  Medication  . 0.9 %  sodium chloride infusion     Allergies:   Patient has no known allergies.   Social History   Socioeconomic History  . Marital status: Single    Spouse name: Not on file  . Number of children: Not on file  . Years of education: Not on file  . Highest education level: Not on file  Occupational History  . Not on file  Social Needs  . Financial resource strain: Not on file  . Food insecurity    Worry: Not on file    Inability: Not on file  . Transportation needs    Medical: Not on file    Non-medical: Not on file  Tobacco Use  . Smoking status: Never Smoker  . Smokeless tobacco: Never Used  Substance and Sexual Activity  . Alcohol use: Never    Frequency: Never  . Drug use: No  . Sexual activity: Not on file  Lifestyle  . Physical activity    Days per week: Not on file    Minutes per session: Not on file  . Stress: Not on file  Relationships  . Social Musician on phone: Not on file    Gets together: Not on file    Attends religious service: Not on file    Active member of club or organization: Not on file    Attends meetings of clubs or organizations: Not on file    Relationship status: Not on  file  Other Topics Concern  . Not on file  Social History Narrative  . Not on file     Family History: The patient's family history is negative for Colon polyps, Rectal cancer, and Stomach cancer. Mother died stroke later in life, no early CAD ROS:   Please see the history of present illness.    Denies any fevers chills nausea vomiting syncope bleeding all other systems reviewed and are negative.  EKGs/Labs/Other Studies Reviewed:    The following studies were reviewed today:  Echocardiogram 07/25/2016:  - Left ventricle: The cavity size was normal. There was mild   concentric hypertrophy. Systolic function was normal. The   estimated ejection fraction was in the range of 55% to 60%. Wall   motion was normal; there were no regional wall motion   abnormalities. Doppler parameters are consistent with abnormal   left ventricular relaxation (grade 1 diastolic dysfunction).   There was no evidence of elevated ventricular filling pressure by   Doppler parameters. - Aortic valve: Trileaflet; moderately thickened, moderately   calcified leaflets. There was mild regurgitation. - Aortic root: The aortic root was normal in size. - Mitral valve: There was no regurgitation. - Tricuspid valve: There was no regurgitation. - Pulmonic valve: There was mild regurgitation. - Pulmonary arteries: Systolic pressure was within the normal   range. - Inferior vena cava: The vessel was normal in size. - Pericardium, extracardiac: There was no pericardial effusion.  EKG:  EKG is  ordered today.  The ekg ordered today demonstrates 07/07/2016-sinus rhythm 84 right bundle branch block.  Recent Labs: No results found for requested labs within last 8760 hours.  Recent Lipid Panel No results found for: CHOL, TRIG, HDL, CHOLHDL, VLDL, LDLCALC, LDLDIRECT  Physical Exam:    VS:  BP 120/80   Pulse 80   Ht 5\' 9"  (1.753 m)   Wt 172 lb 6.4 oz (78.2 kg)   SpO2 96%   BMI 25.46 kg/m     Wt Readings from  Last 3 Encounters:  10/01/18 172 lb 6.4 oz (78.2 kg)  01/02/18 172 lb (78 kg)  12/14/17 172 lb (78 kg)     GEN:  Well nourished, well developed in no acute distress HEENT: Normal NECK: No JVD; No carotid bruits LYMPHATICS: No lymphadenopathy CARDIAC: RRR, 1/6 systolic murmur, no rubs, gallops RESPIRATORY:  Clear to auscultation without rales, wheezing or rhonchi  ABDOMEN: Soft, non-tender, non-distended MUSCULOSKELETAL:  No edema; No deformity  SKIN: Warm and dry NEUROLOGIC:  Alert and oriented x 3 PSYCHIATRIC:  Normal affect   ASSESSMENT:    1. Right bundle branch block   2. Essential hypertension   3. Pure hypertriglyceridemia    PLAN:    In order of problems listed above:  Right bundle branch block - No high risk symptoms such as syncope.  Longstanding.  Stable.  Reassurance.  Essential hypertension - On lisinopril.  Doing well.  Dr. Allena Katz has been monitoring in the past.  He has changed primary physicians recently.  He is to take his blood pressures sitting and standing.  I looked over his list, they were overall very good.  Hyperlipidemia - Atorvastatin, no myalgias.  Prior rheumatic heart disease - Previously described moderately thickened aortic valve with no stenosis.  Likely source of murmur. OK NOT to take ASA. [place] friend.   Medication Adjustments/Labs and Tests Ordered: Current medicines are reviewed at length with the patient today.  Concerns regarding medicines are outlined above.  Orders Placed This Encounter  Procedures  . EKG 12-Lead   No orders of the defined types were placed in this encounter.   Patient Instructions  Medication Instructions:  The current medical regimen is effective;  continue present plan and medications.  If you need a refill on your cardiac medications before your next appointment, please call your pharmacy.   Follow-Up: At [facility], you and your health needs are our priority.  As part of our continuing  mission to provide you with exceptional heart care, we have created designated Provider Care Teams.  These Care Teams include your primary Cardiologist (physician) and Advanced Practice Providers (APPs -  Physician Assistants and Nurse Practitioners) who all work together to provide you with the care you need, when you need it. You will need a follow up appointment in 12 months.  Please call our office 2 months in advance to schedule this appointment.  You may see Donato Schultz, MD or one of the following Advanced Practice Providers on your designated Care Team:   Norma Fredrickson, NP Nada Boozer, NP . Georgie Chard, NP  Thank you for choosing [facility]!!        Signed, Donato Schultz, MD  10/01/2018 11:21 AM    [facility]

## 2018-10-01 NOTE — Progress Notes (Signed)
Cardiology Office Note:    Date:  10/01/2018   ID:  Peter Best, DOB 1940-08-31, MRN [id number]  PCP:  System, Pcp Not In  Cardiologist:  Donato Schultz, MD  Electrophysiologist:  None   Referring MD: No ref. provider found   No chief complaint on file. Here for follow-up of worry about blood pressure fluctuation  History of Present Illness:    Peter Best is a 78 y.o. male with a hx of self-referral previously because of hyperlipidemia, hypertension seen last on 07/07/2016.  He had been on Zoloft in the past for anxiety.  Atorvastatin 20 mg and lisinopril 10 mg.  Aspirin.  He was worried previously that his machine was registering irregular heartbeat sometimes at home no real significant palpitations.  He had rheumatic fever as a child.  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  Patient message on 09/13/2018 with some concerns over blood pressure.  1 day it was 155/67 and then the next day 118/70.  Heart rate normal.  Past Medical History:  Diagnosis Date  . Allergy   . Anxiety disorder   . BBB (bundle branch block)   . Benign prostatic hyperplasia   . 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 Cataract    just watching - bilateral  . Gastroesophageal reflux disease   . HA (headache)    otc med prn  . Hyperlipidemia   . Hypertensive disorder   . Inguinal hernia    repaired by surgery  . Insomnia   . Kidney stones    passed stone - no surgery required  . Major depressive disorder   . 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 COLONOSCOPY W/ POLYPECTOMY  09/2014   polyps  - done in [place]  . COLONOSCOPY W/ POLYPECTOMY     UNCODED SURGICAL HISTORY  . HERNIA REPAIR    . INGUINAL HERNIA REPAIR    . MOHS SURGERY  2018   x x  on nose  . MOHS SURGERY     MOHS' CHEMOSURGERY PROCEDURE DATE: 09/30/2010:BCC R INFERIOR TEMPLE, UNCODED SURGICAL HISTORY  .  TONSILLECTOMY    . WISDOM TOOTH EXTRACTION      Current Medications: Current Meds  Medication Sig  . acetaminophen (TYLENOL) 500 MG tablet Take 1,000 mg by mouth every 6 (six) hours as needed for mild pain.  Marland Kitchen ibuprofen (ADVIL,MOTRIN) 400 MG tablet Take 400 mg by mouth every 6 (six) hours as needed for mild pain.  Marland Kitchen lisinopril (ZESTRIL) 10 MG tablet Take 10 mg by mouth daily.  Marland Kitchen omeprazole (PRILOSEC) 20 MG capsule Take 20 mg by mouth daily.  . sertraline (ZOLOFT) 50 MG tablet sertraline 50 mg tablet  . [DISCONTINUED] lisinopril (PRINIVIL,ZESTRIL) 20 MG tablet Take 20 mg by mouth daily.   Current Facility-Administered Medications for the 10/01/18 encounter (Office Visit) with Jake Bathe, MD  Medication  . 0.9 %  sodium chloride infusion     Allergies:   Patient has no known allergies.   Social History   Socioeconomic History  . Marital status: Single    Spouse name: Not on file  . Number of children: Not on file  . Years of education: Not on file  . Highest education level: Not on file  Occupational History  . Not on file  Social Needs  . Financial resource strain: Not on  file  . Food insecurity    Worry: Not on file    Inability: Not on file  . Transportation needs    Medical: Not on file    Non-medical: Not on file  Tobacco Use  . Smoking status: Never Smoker  . Smokeless tobacco: Never Used  Substance and Sexual Activity  . Alcohol use: Never    Frequency: Never  . Drug use: No  . Sexual activity: Not on file  Lifestyle  . Physical activity    Days per week: Not on file    Minutes per session: Not on file  . Stress: Not on file  Relationships  . Social Musician on phone: Not on file    Gets together: Not on file    Attends religious service: Not on file    Active member of club or organization: Not on file    Attends meetings of clubs or organizations: Not on file    Relationship status: Not on file  Other Topics Concern  . Not on file   Social History Narrative  . Not on file     Family History: The patient's family history is negative for Colon polyps, Rectal cancer, and Stomach cancer. Mother died stroke later in life, no early CAD ROS:   Please see the history of present illness.    No fevers chills nausea vomiting syncope bleeding all other systems reviewed and are negative.  EKGs/Labs/Other Studies Reviewed:    The following studies were reviewed today:  Echocardiogram 07/25/2016:  - Left ventricle: The cavity size was normal. There was mild   concentric hypertrophy. Systolic function was normal. The   estimated ejection fraction was in the range of 55% to 60%. Wall   motion was normal; there were no regional wall motion   abnormalities. Doppler parameters are consistent with abnormal   left ventricular relaxation (grade 1 diastolic dysfunction).   There was no evidence of elevated ventricular filling pressure by   Doppler parameters. - Aortic valve: Trileaflet; moderately thickened, moderately   calcified leaflets. There was mild regurgitation. - Aortic root: The aortic root was normal in size. - Mitral valve: There was no regurgitation. - Tricuspid valve: There was no regurgitation. - Pulmonic valve: There was mild regurgitation. - Pulmonary arteries: Systolic pressure was within the normal   range. - Inferior vena cava: The vessel was normal in size. - Pericardium, extracardiac: There was no pericardial effusion.  EKG:  EKG is  ordered today.  The ekg ordered today demonstrates 07/07/2016-sinus rhythm 84 right bundle branch block.  Recent Labs: No results found for requested labs within last 8760 hours.  Recent Lipid Panel No results found for: CHOL, TRIG, HDL, CHOLHDL, VLDL, LDLCALC, LDLDIRECT  Physical Exam:    VS:  BP 120/80   Pulse 80   Ht 5\' 9"  (1.753 m)   Wt 172 lb 6.4 oz (78.2 kg)   SpO2 96%   BMI 25.46 kg/m     Wt Readings from Last 3 Encounters:  10/01/18 172 lb 6.4 oz (78.2 kg)   01/02/18 172 lb (78 kg)  12/14/17 172 lb (78 kg)     GEN:  Well nourished, well developed in no acute distress HEENT: Normal NECK: No JVD; No carotid bruits LYMPHATICS: No lymphadenopathy CARDIAC: RRR, 2/6 SM, no rubs, gallops RESPIRATORY:  Clear to auscultation without rales, wheezing or rhonchi  ABDOMEN: Soft, non-tender, non-distended MUSCULOSKELETAL:  No edema; No deformity  SKIN: Warm and dry NEUROLOGIC:  Alert  and oriented x 3 PSYCHIATRIC:  Normal affect   ASSESSMENT:    1. Right bundle branch block   2. Essential hypertension   3. Pure hypertriglyceridemia    PLAN:    In order of problems listed above:  Right bundle branch block - No high risk symptoms such as syncope.  Longstanding.  Stable.  Reassurance.  Essential hypertension - On lisinopril.  Doing well.  Dr. Allena Katz has been monitoring in the past.  Hyperlipidemia - Atorvastatin, no myalgias.  Prior rheumatic heart disease - Previously described moderately thickened aortic valve with no stenosis.  Likely source of murmur.   Medication Adjustments/Labs and Tests Ordered: Current medicines are reviewed at length with the patient today.  Concerns regarding medicines are outlined above.  Orders Placed This Encounter  Procedures  . EKG 12-Lead   No orders of the defined types were placed in this encounter.   Patient Instructions  Medication Instructions:  The current medical regimen is effective;  continue present plan and medications.  If you need a refill on your cardiac medications before your next appointment, please call your pharmacy.   Follow-Up: At [facility], you and your health needs are our priority.  As part of our continuing mission to provide you with exceptional heart care, we have created designated Provider Care Teams.  These Care Teams include your primary Cardiologist (physician) and Advanced Practice Providers (APPs -  Physician Assistants and Nurse Practitioners) who all work  together to provide you with the care you need, when you need it. You will need a follow up appointment in 12 months.  Please call our office 2 months in advance to schedule this appointment.  You may see Donato Schultz, MD or one of the following Advanced Practice Providers on your designated Care Team:   Norma Fredrickson, NP Nada Boozer, NP . Georgie Chard, NP  Thank you for choosing [facility]!!        Signed, Donato Schultz, MD  10/01/2018 11:23 AM    [facility]

## 2018-10-01 NOTE — Patient Instructions (Signed)
Medication Instructions:  The current medical regimen is effective;  continue present plan and medications.  If you need a refill on your cardiac medications before your next appointment, please call your pharmacy.   Follow-Up: At [facility], you and your health needs are our priority.  As part of our continuing mission to provide you with exceptional heart care, we have created designated Provider Care Teams.  These Care Teams include your primary Cardiologist (physician) and Advanced Practice Providers (APPs -  Physician Assistants and Nurse Practitioners) who all work together to provide you with the care you need, when you need it. You will need a follow up appointment in 12 months.  Please call our office 2 months in advance to schedule this appointment.  You may see Mark Skains, MD or one of the following Advanced Practice Providers on your designated Care Team:   Lori Gerhardt, NP Laura Ingold, NP . Jill McDaniel, NP  Thank you for choosing [facility]!!      

## 2019-01-03 DIAGNOSIS — H25813 Combined forms of age-related cataract, bilateral: Secondary | ICD-10-CM | POA: Diagnosis not present

## 2019-01-03 DIAGNOSIS — H524 Presbyopia: Secondary | ICD-10-CM | POA: Diagnosis not present

## 2019-01-03 DIAGNOSIS — H52203 Unspecified astigmatism, bilateral: Secondary | ICD-10-CM | POA: Diagnosis not present

## 2019-01-07 DIAGNOSIS — G479 Sleep disorder, unspecified: Secondary | ICD-10-CM | POA: Diagnosis not present

## 2019-01-07 DIAGNOSIS — Z79899 Other long term (current) drug therapy: Secondary | ICD-10-CM | POA: Diagnosis not present

## 2019-01-07 DIAGNOSIS — E7800 Pure hypercholesterolemia, unspecified: Secondary | ICD-10-CM | POA: Diagnosis not present

## 2019-01-07 DIAGNOSIS — I4510 Unspecified right bundle-branch block: Secondary | ICD-10-CM | POA: Diagnosis not present

## 2019-01-07 DIAGNOSIS — K2100 Gastro-esophageal reflux disease with esophagitis, without bleeding: Secondary | ICD-10-CM | POA: Diagnosis not present

## 2019-01-07 DIAGNOSIS — I10 Essential (primary) hypertension: Secondary | ICD-10-CM | POA: Diagnosis not present

## 2019-01-07 DIAGNOSIS — R69 Illness, unspecified: Secondary | ICD-10-CM | POA: Diagnosis not present

## 2019-01-07 DIAGNOSIS — D692 Other nonthrombocytopenic purpura: Secondary | ICD-10-CM | POA: Diagnosis not present

## 2019-01-21 DIAGNOSIS — Z03818 Encounter for observation for suspected exposure to other biological agents ruled out: Secondary | ICD-10-CM | POA: Diagnosis not present

## 2019-01-21 DIAGNOSIS — Z20828 Contact with and (suspected) exposure to other viral communicable diseases: Secondary | ICD-10-CM | POA: Diagnosis not present

## 2019-01-28 DIAGNOSIS — L853 Xerosis cutis: Secondary | ICD-10-CM | POA: Diagnosis not present

## 2019-01-28 DIAGNOSIS — Z85828 Personal history of other malignant neoplasm of skin: Secondary | ICD-10-CM | POA: Diagnosis not present

## 2019-01-28 DIAGNOSIS — L821 Other seborrheic keratosis: Secondary | ICD-10-CM | POA: Diagnosis not present

## 2019-01-28 DIAGNOSIS — L570 Actinic keratosis: Secondary | ICD-10-CM | POA: Diagnosis not present

## 2019-01-28 DIAGNOSIS — L905 Scar conditions and fibrosis of skin: Secondary | ICD-10-CM | POA: Diagnosis not present

## 2019-02-13 DIAGNOSIS — H903 Sensorineural hearing loss, bilateral: Secondary | ICD-10-CM

## 2019-02-13 DIAGNOSIS — M26609 Unspecified temporomandibular joint disorder, unspecified side: Secondary | ICD-10-CM | POA: Diagnosis not present

## 2019-02-13 HISTORY — DX: Sensorineural hearing loss, bilateral: H90.3

## 2019-02-13 HISTORY — DX: Unspecified temporomandibular joint disorder, unspecified side: M26.609

## 2019-03-26 DIAGNOSIS — R69 Illness, unspecified: Secondary | ICD-10-CM | POA: Diagnosis not present

## 2019-03-27 DIAGNOSIS — Z8249 Family history of ischemic heart disease and other diseases of the circulatory system: Secondary | ICD-10-CM | POA: Diagnosis not present

## 2019-03-27 DIAGNOSIS — G44029 Chronic cluster headache, not intractable: Secondary | ICD-10-CM | POA: Diagnosis not present

## 2019-03-27 DIAGNOSIS — Z7951 Long term (current) use of inhaled steroids: Secondary | ICD-10-CM | POA: Diagnosis not present

## 2019-03-27 DIAGNOSIS — J302 Other seasonal allergic rhinitis: Secondary | ICD-10-CM | POA: Diagnosis not present

## 2019-03-27 DIAGNOSIS — R002 Palpitations: Secondary | ICD-10-CM | POA: Diagnosis not present

## 2019-03-27 DIAGNOSIS — K219 Gastro-esophageal reflux disease without esophagitis: Secondary | ICD-10-CM | POA: Diagnosis not present

## 2019-03-27 DIAGNOSIS — R69 Illness, unspecified: Secondary | ICD-10-CM | POA: Diagnosis not present

## 2019-03-27 DIAGNOSIS — E785 Hyperlipidemia, unspecified: Secondary | ICD-10-CM | POA: Diagnosis not present

## 2019-03-27 DIAGNOSIS — I10 Essential (primary) hypertension: Secondary | ICD-10-CM | POA: Diagnosis not present

## 2019-03-27 DIAGNOSIS — Z823 Family history of stroke: Secondary | ICD-10-CM | POA: Diagnosis not present

## 2019-03-29 ENCOUNTER — Ambulatory Visit: Payer: Medicare Other | Attending: Internal Medicine

## 2019-03-29 DIAGNOSIS — Z23 Encounter for immunization: Secondary | ICD-10-CM | POA: Insufficient documentation

## 2019-03-29 NOTE — Progress Notes (Signed)
Covid-19 Vaccination Clinic  Name:  Peter Best    MRN: [id number] DOB: [birth year]  03/29/2019  [name] was observed post Covid-19 immunization for 15 minutes without incidence. He was provided with Vaccine Information Sheet and instruction to access the V-Safe system.   Mr. Duty was instructed to call 911 with any severe reactions post vaccine: Marland Kitchen Difficulty breathing  . Swelling of your face and throat  . A fast heartbeat  . A bad rash all over your body  . Dizziness and weakness    Immunizations Administered    Name Date Dose VIS Date Route   Pfizer COVID-19 Vaccine 03/29/2019 11:15 AM 0.3 mL 02/22/2019 Intramuscular   Manufacturer: [employer], [employer]   Lot: [id number]   NDC: [address]-3

## 2019-04-02 DIAGNOSIS — L905 Scar conditions and fibrosis of skin: Secondary | ICD-10-CM | POA: Diagnosis not present

## 2019-04-02 DIAGNOSIS — Z85828 Personal history of other malignant neoplasm of skin: Secondary | ICD-10-CM | POA: Diagnosis not present

## 2019-04-02 DIAGNOSIS — D485 Neoplasm of uncertain behavior of skin: Secondary | ICD-10-CM | POA: Diagnosis not present

## 2019-04-02 DIAGNOSIS — Z0100 Encounter for examination of eyes and vision without abnormal findings: Secondary | ICD-10-CM | POA: Diagnosis not present

## 2019-04-02 DIAGNOSIS — L439 Lichen planus, unspecified: Secondary | ICD-10-CM | POA: Diagnosis not present

## 2019-04-02 DIAGNOSIS — H524 Presbyopia: Secondary | ICD-10-CM | POA: Diagnosis not present

## 2019-04-03 DIAGNOSIS — R69 Illness, unspecified: Secondary | ICD-10-CM | POA: Diagnosis not present

## 2019-04-16 ENCOUNTER — Ambulatory Visit: Payer: Medicare HMO | Attending: Internal Medicine

## 2019-04-16 ENCOUNTER — Ambulatory Visit: Payer: Medicare HMO

## 2019-04-16 DIAGNOSIS — Z23 Encounter for immunization: Secondary | ICD-10-CM

## 2019-04-16 NOTE — Progress Notes (Signed)
Covid-19 Vaccination Clinic  Name:  Randahl Abdul    MRN: [id number] DOB: Nov 21, 1940  04/16/2019  Mr. Plack was observed post Covid-19 immunization for 15 minutes without incidence. He was provided with Vaccine Information Sheet and instruction to access the V-Safe system.   Mr. Humpert was instructed to call 911 with any severe reactions post vaccine: Marland Kitchen Difficulty breathing  . Swelling of your face and throat  . A fast heartbeat  . A bad rash all over your body  . Dizziness and weakness    Immunizations Administered    Name Date Dose VIS Date Route   Pfizer COVID-19 Vaccine 04/16/2019  9:50 AM 0.3 mL 02/22/2019 Intramuscular   Manufacturer: [employer], [employer]   Lot: [id number]   NDC: [address]-2

## 2019-07-01 DIAGNOSIS — H6120 Impacted cerumen, unspecified ear: Secondary | ICD-10-CM | POA: Diagnosis not present

## 2019-07-31 ENCOUNTER — Encounter: Payer: Self-pay | Admitting: Cardiology

## 2019-07-31 NOTE — Telephone Encounter (Signed)
error 

## 2019-09-25 DIAGNOSIS — H5712 Ocular pain, left eye: Secondary | ICD-10-CM | POA: Diagnosis not present

## 2019-10-07 ENCOUNTER — Ambulatory Visit: Payer: Medicare HMO | Admitting: Cardiology

## 2019-10-07 ENCOUNTER — Other Ambulatory Visit: Payer: Self-pay

## 2019-10-07 ENCOUNTER — Encounter: Payer: Self-pay | Admitting: Cardiology

## 2019-10-07 VITALS — BP 120/80 | HR 76 | Ht 69.0 in | Wt 166.2 lb

## 2019-10-07 DIAGNOSIS — I10 Essential (primary) hypertension: Secondary | ICD-10-CM

## 2019-10-07 DIAGNOSIS — E781 Pure hyperglyceridemia: Secondary | ICD-10-CM

## 2019-10-07 DIAGNOSIS — I4510 Unspecified right bundle-branch block: Secondary | ICD-10-CM | POA: Diagnosis not present

## 2019-10-07 NOTE — Patient Instructions (Signed)
Medication Instructions:  The current medical regimen is effective;  continue present plan and medications.  *If you need a refill on your cardiac medications before your next appointment, please call your pharmacy*  Follow-Up: At [facility], you and your health needs are our priority.  As part of our continuing mission to provide you with exceptional heart care, we have created designated Provider Care Teams.  These Care Teams include your primary Cardiologist (physician) and Advanced Practice Providers (APPs -  Physician Assistants and Nurse Practitioners) who all work together to provide you with the care you need, when you need it.  We recommend signing up for the patient portal called "MyChart".  Sign up information is provided on this After Visit Summary.  MyChart is used to connect with patients for Virtual Visits (Telemedicine).  Patients are able to view lab/test results, encounter notes, upcoming appointments, etc.  Non-urgent messages can be sent to your provider as well.   To learn more about what you can do with MyChart, go to [email or web address].    Your next appointment:   12 month(s)  The format for your next appointment:   In Person  Provider:   Mark Skains, MD   Thank you for choosing [facility]!!      

## 2019-10-07 NOTE — Progress Notes (Signed)
Cardiology Office Note:    Date:  10/07/2019   ID:  [name], DOB [birth year], MRN [id number]  PCP:  [name], MD  [facility] Cardiologist:  Donato Schultz, MD  [facility] Electrophysiologist:  None   Referring MD: No ref. provider found     History of Present Illness:    Peter Best is a 79 y.o. male here for the follow-up of hypertension hyperlipidemia right bundle branch block.  Had rheumatic fever as a child.  Worked in radio and television previously.  Has been on statin and ACE inhibitor for quite some time.  Doing well.  Also on Zoloft in the past for anxiety.  In the past, when checking blood pressure, device had measured irregular heartbeat at 1 point.  Been doing very well lives at [place].  Uses the exercise machine there continues to walk.  Sometimes feels short of breath when walking uphill but this is normal.  No syncope no bleeding no fevers no chills no chest pain.  Past Medical History:  Diagnosis Date  . Allergy   . Anxiety disorder   . BBB (bundle branch block)   . Benign prostatic hyperplasia   . 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 Cataract    just watching - bilateral  . Gastroesophageal reflux disease   . HA (headache)    otc med prn  . Hyperlipidemia   . Hypertensive disorder   . Inguinal hernia    repaired by surgery  . Insomnia   . Kidney stones    passed stone - no surgery required  . Major depressive disorder   . 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 COLONOSCOPY W/ POLYPECTOMY  09/2014   polyps  - done in [place]  . COLONOSCOPY W/ POLYPECTOMY     UNCODED SURGICAL HISTORY  . HERNIA REPAIR    . INGUINAL HERNIA REPAIR    . MOHS SURGERY  2018   x x  on nose  . MOHS SURGERY     MOHS' CHEMOSURGERY PROCEDURE DATE: 09/30/2010:BCC R INFERIOR TEMPLE, UNCODED SURGICAL HISTORY  . TONSILLECTOMY    . WISDOM TOOTH EXTRACTION      Current  Medications: Current Meds  Medication Sig  . acetaminophen (TYLENOL) 500 MG tablet Take 1,000 mg by mouth every 6 (six) hours as needed for mild pain.  Marland Kitchen atorvastatin (LIPITOR) 20 MG tablet Take 20 mg by mouth daily.  Marland Kitchen ibuprofen (ADVIL,MOTRIN) 400 MG tablet Take 400 mg by mouth every 6 (six) hours as needed for mild pain.  Marland Kitchen lisinopril (ZESTRIL) 10 MG tablet Take 10 mg by mouth daily.  Marland Kitchen omeprazole (PRILOSEC) 20 MG capsule Take 20 mg by mouth daily.  . sertraline (ZOLOFT) 50 MG tablet sertraline 50 mg tablet   Current Facility-Administered Medications for the 10/07/19 encounter (Office Visit) with Jake Bathe, MD  Medication  . 0.9 %  sodium chloride infusion     Allergies:   Patient has no known allergies.   Social History   Socioeconomic History  . Marital status: Single    Spouse name: Not on file  . Number of children: Not on file  . Years of education: Not on file  . Highest education level: Not on file  Occupational History  . Not on file  Tobacco Use  . Smoking status: Never Smoker  . Smokeless tobacco: Never Used  Vaping  Use  . Vaping Use: Never used  Substance and Sexual Activity  . Alcohol use: Never  . Drug use: No  . Sexual activity: Not on file  Other Topics Concern  . Not on file  Social History Narrative  . Not on file   Social Determinants of Health   Financial Resource Strain:   . Difficulty of Paying Living Expenses:   Food Insecurity:   . Worried About Programme researcher, broadcasting/film/video in the Last Year:   . Barista in the Last Year:   Transportation Needs:   . Freight forwarder (Medical):   Marland Kitchen Lack of Transportation (Non-Medical):   Physical Activity:   . Days of Exercise per Week:   . Minutes of Exercise per Session:   Stress:   . Feeling of Stress :   Social Connections:   . Frequency of Communication with Friends and Family:   . Frequency of Social Gatherings with Friends and Family:   . Attends Religious Services:   . Active Member  of Clubs or Organizations:   . Attends Banker Meetings:   Marland Kitchen Marital Status:      Family History: The patient's family history is negative for Colon polyps, Rectal cancer, and Stomach cancer.  ROS:   Please see the history of present illness.     All other systems reviewed and are negative.  EKGs/Labs/Other Studies Reviewed:    The following studies were reviewed today:  - Left ventricle: The cavity size was normal. There was mild  concentric hypertrophy. Systolic function was normal. The  estimated ejection fraction was in the range of 55% to 60%. Wall  motion was normal; there were no regional wall motion  abnormalities. Doppler parameters are consistent with abnormal  left ventricular relaxation (grade 1 diastolic dysfunction).  There was no evidence of elevated ventricular filling pressure by  Doppler parameters.  - Aortic valve: Trileaflet; moderately thickened, moderately  calcified leaflets. There was mild regurgitation.  - Aortic root: The aortic root was normal in size.  - Mitral valve: There was no regurgitation.  - Tricuspid valve: There was no regurgitation.  - Pulmonic valve: There was mild regurgitation.  - Pulmonary arteries: Systolic pressure was within the normal  range.  - Inferior vena cava: The vessel was normal in size.  - Pericardium, extracardiac: There was no pericardial effusion.   EKG:  EKG is  ordered today.  The ekg ordered today demonstrates sinus rhythm right bundle branch block 76 bpm  Recent Labs: No results found for requested labs within last 8760 hours.  Recent Lipid Panel No results found for: CHOL, TRIG, HDL, CHOLHDL, VLDL, LDLCALC, LDLDIRECT  Physical Exam:    VS:  BP 120/80   Pulse 76   Ht 5\' 9"  (1.753 m)   Wt 166 lb 3.2 oz (75.4 kg)   SpO2 96%   BMI 24.54 kg/m     Wt Readings from Last 3 Encounters:  10/07/19 166 lb 3.2 oz (75.4 kg)  10/01/18 172 lb 6.4 oz (78.2 kg)  01/02/18 172 lb (78 kg)       GEN:  Well nourished, well developed in no acute distress HEENT: Normal NECK: No JVD; No carotid bruits LYMPHATICS: No lymphadenopathy CARDIAC: RRR, soft systolic murmur,no  rubs, gallops RESPIRATORY:  Clear to auscultation without rales, wheezing or rhonchi  ABDOMEN: Soft, non-tender, non-distended MUSCULOSKELETAL:  No edema; No deformity  SKIN: Warm and dry NEUROLOGIC:  Alert and oriented x 3 PSYCHIATRIC:  Normal  affect   ASSESSMENT:    1. Right bundle branch block   2. Essential hypertension   3. Pure hypertriglyceridemia    PLAN:    In order of problems listed above:   Right bundle branch block - No high risk symptoms such as syncope.  Longstanding.  Stable.  Normal ejection fraction.  Doing well.  Explained the physiology behind this.  He checks his blood pressure, heart rate periodically.  Essential hypertension - On lisinopril.  Doing well.  Dr.Ehinger has been monitoring.  Overall well controlled.  Hyperlipidemia - Atorvastatin, no myalgias.  Doing well.  Prevention.  LDL 106  Prior rheumatic heart disease - Previously described moderately thickened aortic valve with no stenosis.  Likely source of soft murmur.   Medication Adjustments/Labs and Tests Ordered: Current medicines are reviewed at length with the patient today.  Concerns regarding medicines are outlined above.  Orders Placed This Encounter  Procedures  . EKG 12-Lead   No orders of the defined types were placed in this encounter.   Patient Instructions  Medication Instructions:  The current medical regimen is effective;  continue present plan and medications.  *If you need a refill on your cardiac medications before your next appointment, please call your pharmacy*  Follow-Up: At [facility], you and your health needs are our priority.  As part of our continuing mission to provide you with exceptional heart care, we have created designated Provider Care Teams.  These Care Teams include  your primary Cardiologist (physician) and Advanced Practice Providers (APPs -  Physician Assistants and Nurse Practitioners) who all work together to provide you with the care you need, when you need it.  We recommend signing up for the patient portal called "MyChart".  Sign up information is provided on this After Visit Summary.  MyChart is used to connect with patients for Virtual Visits (Telemedicine).  Patients are able to view lab/test results, encounter notes, upcoming appointments, etc.  Non-urgent messages can be sent to your provider as well.   To learn more about what you can do with MyChart, go to [email or web address].    Your next appointment:   12 month(s)  The format for your next appointment:   In Person  Provider:   Donato Schultz, MD  Thank you for choosing [facility]!!        Signed, Donato Schultz, MD  10/07/2019 9:03 AM    [facility]

## 2019-10-09 DIAGNOSIS — R05 Cough: Secondary | ICD-10-CM | POA: Diagnosis not present

## 2019-10-09 DIAGNOSIS — U071 COVID-19: Secondary | ICD-10-CM | POA: Diagnosis not present

## 2019-10-10 ENCOUNTER — Other Ambulatory Visit: Payer: Self-pay | Admitting: Physician Assistant

## 2019-10-10 ENCOUNTER — Telehealth: Payer: Self-pay | Admitting: Physician Assistant

## 2019-10-10 DIAGNOSIS — I10 Essential (primary) hypertension: Secondary | ICD-10-CM

## 2019-10-10 DIAGNOSIS — U071 COVID-19: Secondary | ICD-10-CM

## 2019-10-10 MED ORDER — SODIUM CHLORIDE 0.9 % IV SOLN
Freq: Once | INTRAVENOUS | Status: AC
  Filled 2019-10-10: qty 600

## 2019-10-10 NOTE — Telephone Encounter (Signed)
Called to discuss with patient about Covid symptoms and the use of bamlanivimab/etesevimab or casirivimab/imdevimab, a monoclonal antibody infusion for those with mild to moderate Covid symptoms and at a high risk of hospitalization.  Pt is qualified for this infusion at the [facility] infusion center due to Age > 65   Message left to call back our hotline [phone] and sent mychargt message.   Angelena Form PA-C  [facility]

## 2019-10-10 NOTE — Progress Notes (Signed)
I connected by phone with Peter Best on 10/10/2019 at 9:44 AM to discuss the potential use of a new treatment for mild to moderate COVID-19 viral infection in non-hospitalized patients.  This patient is a 79 y.o. male that meets the FDA criteria for Emergency Use Authorization of COVID monoclonal antibody casirivimab/imdevimab.  Has a (+) direct SARS-CoV-2 viral test result  Has mild or moderate COVID-19   Is NOT hospitalized due to COVID-19  Is within 10 days of symptom onset  Has at least one of the high risk factor(s) for progression to severe COVID-19 and/or hospitalization as defined in EUA.  Specific high risk criteria : BMI > 25 and CVD   I have spoken and communicated the following to the patient or parent/caregiver regarding COVID monoclonal antibody treatment:  1. FDA has authorized the emergency use for the treatment of mild to moderate COVID-19 in adults and pediatric patients with positive results of direct SARS-CoV-2 viral testing who are 36 years of age and older weighing at least 40 kg, and who are at high risk for progressing to severe COVID-19 and/or hospitalization.  2. The significant known and potential risks and benefits of COVID monoclonal antibody, and the extent to which such potential risks and benefits are unknown.  3. Information on available alternative treatments and the risks and benefits of those alternatives, including clinical trials.  4. Patients treated with COVID monoclonal antibody should continue to self-isolate and use infection control measures (e.g., wear mask, isolate, social distance, avoid sharing personal items, clean and disinfect "high touch" surfaces, and frequent handwashing) according to CDC guidelines.   5. The patient or parent/caregiver has the option to accept or refuse COVID monoclonal antibody treatment.  After reviewing this information with the patient, The patient agreed to proceed with receiving casirivimab\imdevimab infusion  and will be provided a copy of the Fact sheet prior to receiving the infusion.  Sx onset 7/27. Set up for infusion on 7/30 @ 10:30am. Directions given to [facility]. Pt is aware that insurance will be charged an infusion fee.  Of note, pt is fully vaccinated and added to our breakthrough list.   Angelena Form 10/10/2019 9:44 AM

## 2019-10-11 ENCOUNTER — Ambulatory Visit (HOSPITAL_COMMUNITY)
Admission: RE | Admit: 2019-10-11 | Discharge: 2019-10-11 | Disposition: A | Discharge status: Home / Self Care | Payer: Medicare Other | Source: Ambulatory Visit | Attending: Pulmonary Disease | Admitting: Pulmonary Disease

## 2019-10-11 DIAGNOSIS — I10 Essential (primary) hypertension: Secondary | ICD-10-CM | POA: Diagnosis present

## 2019-10-11 DIAGNOSIS — U071 COVID-19: Secondary | ICD-10-CM

## 2019-10-11 DIAGNOSIS — Z23 Encounter for immunization: Secondary | ICD-10-CM | POA: Insufficient documentation

## 2019-10-11 MED ORDER — FAMOTIDINE IN NACL 20-0.9 MG/50ML-% IV SOLN: 20.0000 mg | Freq: Once | INTRAVENOUS | Status: DC | PRN

## 2019-10-11 MED ORDER — METHYLPREDNISOLONE SODIUM SUCC 125 MG IJ SOLR: 125.0000 mg | Freq: Once | INTRAMUSCULAR | Status: DC | PRN

## 2019-10-11 MED ORDER — DIPHENHYDRAMINE HCL 50 MG/ML IJ SOLN: 50.0000 mg | Freq: Once | INTRAMUSCULAR | Status: DC | PRN

## 2019-10-11 MED ORDER — ALBUTEROL SULFATE HFA 108 (90 BASE) MCG/ACT IN AERS: 2.0000 | INHALATION_SPRAY | Freq: Once | RESPIRATORY_TRACT | Status: DC | PRN

## 2019-10-11 MED ORDER — SODIUM CHLORIDE 0.9 % IV SOLN: INTRAVENOUS | Status: DC | PRN

## 2019-10-11 MED ORDER — EPINEPHRINE 0.3 MG/0.3ML IJ SOAJ: 0.3000 mg | Freq: Once | INTRAMUSCULAR | Status: DC | PRN

## 2019-10-11 NOTE — Discharge Instructions (Signed)

## 2019-10-11 NOTE — Progress Notes (Signed)
  Diagnosis: COVID-19  Physician:Dr Wright   Procedure: [facility] Med: casirivimab\imdevimab infusion - Provided patient with casirivimab\imdevimab fact sheet for patients, parents and caregivers prior to infusion.  Complications: No immediate complications noted.  Discharge: Discharged home   Clark Cuff W 10/11/2019  

## 2019-11-15 DIAGNOSIS — Z85828 Personal history of other malignant neoplasm of skin: Secondary | ICD-10-CM | POA: Diagnosis not present

## 2019-11-15 DIAGNOSIS — Z008 Encounter for other general examination: Secondary | ICD-10-CM | POA: Diagnosis not present

## 2019-11-15 DIAGNOSIS — I10 Essential (primary) hypertension: Secondary | ICD-10-CM | POA: Diagnosis not present

## 2019-11-15 DIAGNOSIS — E785 Hyperlipidemia, unspecified: Secondary | ICD-10-CM | POA: Diagnosis not present

## 2019-11-15 DIAGNOSIS — Z8249 Family history of ischemic heart disease and other diseases of the circulatory system: Secondary | ICD-10-CM | POA: Diagnosis not present

## 2019-11-15 DIAGNOSIS — M1990 Unspecified osteoarthritis, unspecified site: Secondary | ICD-10-CM | POA: Diagnosis not present

## 2019-11-15 DIAGNOSIS — Z823 Family history of stroke: Secondary | ICD-10-CM | POA: Diagnosis not present

## 2019-11-15 DIAGNOSIS — R69 Illness, unspecified: Secondary | ICD-10-CM | POA: Diagnosis not present

## 2019-11-15 DIAGNOSIS — G3184 Mild cognitive impairment, so stated: Secondary | ICD-10-CM | POA: Diagnosis not present

## 2019-11-15 DIAGNOSIS — K219 Gastro-esophageal reflux disease without esophagitis: Secondary | ICD-10-CM | POA: Diagnosis not present

## 2019-11-19 DIAGNOSIS — R69 Illness, unspecified: Secondary | ICD-10-CM | POA: Diagnosis not present

## 2020-01-21 DIAGNOSIS — H6123 Impacted cerumen, bilateral: Secondary | ICD-10-CM | POA: Diagnosis not present

## 2020-01-21 DIAGNOSIS — H903 Sensorineural hearing loss, bilateral: Secondary | ICD-10-CM | POA: Diagnosis not present

## 2020-01-21 DIAGNOSIS — M26609 Unspecified temporomandibular joint disorder, unspecified side: Secondary | ICD-10-CM | POA: Diagnosis not present

## 2020-01-21 HISTORY — DX: Impacted cerumen, bilateral: H61.23

## 2020-02-24 DIAGNOSIS — E7800 Pure hypercholesterolemia, unspecified: Secondary | ICD-10-CM | POA: Diagnosis not present

## 2020-02-24 DIAGNOSIS — G479 Sleep disorder, unspecified: Secondary | ICD-10-CM | POA: Diagnosis not present

## 2020-02-24 DIAGNOSIS — I4510 Unspecified right bundle-branch block: Secondary | ICD-10-CM | POA: Diagnosis not present

## 2020-02-24 DIAGNOSIS — R0789 Other chest pain: Secondary | ICD-10-CM | POA: Diagnosis not present

## 2020-02-24 DIAGNOSIS — Z23 Encounter for immunization: Secondary | ICD-10-CM | POA: Diagnosis not present

## 2020-02-24 DIAGNOSIS — Z0000 Encounter for general adult medical examination without abnormal findings: Secondary | ICD-10-CM | POA: Diagnosis not present

## 2020-02-24 DIAGNOSIS — I10 Essential (primary) hypertension: Secondary | ICD-10-CM | POA: Diagnosis not present

## 2020-02-24 DIAGNOSIS — R5383 Other fatigue: Secondary | ICD-10-CM | POA: Diagnosis not present

## 2020-02-24 DIAGNOSIS — R69 Illness, unspecified: Secondary | ICD-10-CM | POA: Diagnosis not present

## 2020-02-24 DIAGNOSIS — Z1389 Encounter for screening for other disorder: Secondary | ICD-10-CM | POA: Diagnosis not present

## 2020-02-28 DIAGNOSIS — M5417 Radiculopathy, lumbosacral region: Secondary | ICD-10-CM | POA: Diagnosis not present

## 2020-02-28 DIAGNOSIS — M25511 Pain in right shoulder: Secondary | ICD-10-CM | POA: Diagnosis not present

## 2020-02-28 DIAGNOSIS — M25551 Pain in right hip: Secondary | ICD-10-CM | POA: Diagnosis not present

## 2020-02-28 DIAGNOSIS — M545 Low back pain, unspecified: Secondary | ICD-10-CM | POA: Diagnosis not present

## 2020-04-03 DIAGNOSIS — Z20822 Contact with and (suspected) exposure to covid-19: Secondary | ICD-10-CM | POA: Diagnosis not present

## 2020-04-03 DIAGNOSIS — Z03818 Encounter for observation for suspected exposure to other biological agents ruled out: Secondary | ICD-10-CM | POA: Diagnosis not present

## 2020-04-13 DIAGNOSIS — D229 Melanocytic nevi, unspecified: Secondary | ICD-10-CM | POA: Diagnosis not present

## 2020-04-13 DIAGNOSIS — L853 Xerosis cutis: Secondary | ICD-10-CM | POA: Diagnosis not present

## 2020-04-13 DIAGNOSIS — L819 Disorder of pigmentation, unspecified: Secondary | ICD-10-CM | POA: Diagnosis not present

## 2020-04-13 DIAGNOSIS — L570 Actinic keratosis: Secondary | ICD-10-CM | POA: Diagnosis not present

## 2020-04-13 DIAGNOSIS — D1801 Hemangioma of skin and subcutaneous tissue: Secondary | ICD-10-CM | POA: Diagnosis not present

## 2020-04-13 DIAGNOSIS — L821 Other seborrheic keratosis: Secondary | ICD-10-CM | POA: Diagnosis not present

## 2020-04-13 DIAGNOSIS — L814 Other melanin hyperpigmentation: Secondary | ICD-10-CM | POA: Diagnosis not present

## 2020-04-14 DIAGNOSIS — H25813 Combined forms of age-related cataract, bilateral: Secondary | ICD-10-CM | POA: Diagnosis not present

## 2020-05-27 DIAGNOSIS — R519 Headache, unspecified: Secondary | ICD-10-CM | POA: Diagnosis not present

## 2020-05-27 DIAGNOSIS — M25551 Pain in right hip: Secondary | ICD-10-CM | POA: Diagnosis not present

## 2020-05-27 DIAGNOSIS — R3915 Urgency of urination: Secondary | ICD-10-CM | POA: Diagnosis not present

## 2020-05-27 DIAGNOSIS — I10 Essential (primary) hypertension: Secondary | ICD-10-CM | POA: Diagnosis not present

## 2020-06-01 DIAGNOSIS — M5136 Other intervertebral disc degeneration, lumbar region: Secondary | ICD-10-CM | POA: Diagnosis not present

## 2020-06-04 DIAGNOSIS — M545 Low back pain, unspecified: Secondary | ICD-10-CM

## 2020-06-04 HISTORY — DX: Low back pain, unspecified: M54.50

## 2020-06-11 DIAGNOSIS — M545 Low back pain, unspecified: Secondary | ICD-10-CM | POA: Diagnosis not present

## 2020-06-25 DIAGNOSIS — M545 Low back pain, unspecified: Secondary | ICD-10-CM | POA: Diagnosis not present

## 2020-07-03 DIAGNOSIS — M2740 Unspecified cyst of jaw: Secondary | ICD-10-CM | POA: Diagnosis not present

## 2020-07-03 DIAGNOSIS — K091 Developmental (nonodontogenic) cysts of oral region: Secondary | ICD-10-CM | POA: Diagnosis not present

## 2020-07-15 DIAGNOSIS — H2511 Age-related nuclear cataract, right eye: Secondary | ICD-10-CM | POA: Diagnosis not present

## 2020-07-15 DIAGNOSIS — H25812 Combined forms of age-related cataract, left eye: Secondary | ICD-10-CM | POA: Diagnosis not present

## 2020-07-29 DIAGNOSIS — H25012 Cortical age-related cataract, left eye: Secondary | ICD-10-CM | POA: Diagnosis not present

## 2020-07-29 DIAGNOSIS — L298 Other pruritus: Secondary | ICD-10-CM | POA: Diagnosis not present

## 2020-07-29 DIAGNOSIS — L308 Other specified dermatitis: Secondary | ICD-10-CM | POA: Diagnosis not present

## 2020-07-29 DIAGNOSIS — H2512 Age-related nuclear cataract, left eye: Secondary | ICD-10-CM | POA: Diagnosis not present

## 2020-08-27 DIAGNOSIS — R197 Diarrhea, unspecified: Secondary | ICD-10-CM | POA: Diagnosis not present

## 2020-08-27 DIAGNOSIS — R490 Dysphonia: Secondary | ICD-10-CM | POA: Diagnosis not present

## 2020-10-05 DIAGNOSIS — R350 Frequency of micturition: Secondary | ICD-10-CM | POA: Diagnosis not present

## 2020-10-12 DIAGNOSIS — L819 Disorder of pigmentation, unspecified: Secondary | ICD-10-CM | POA: Diagnosis not present

## 2020-10-12 DIAGNOSIS — L570 Actinic keratosis: Secondary | ICD-10-CM | POA: Diagnosis not present

## 2020-10-13 NOTE — Progress Notes (Deleted)
Cardiology Office Note    Date:  10/13/2020   ID:  Peter Best, DOB June 23, 1940, MRN [id number]   PCP:  [name], MD   [facility]  Cardiologist:  Donato Schultz, MD *** Advanced Practice Provider:  No care team member to display Electrophysiologist:  None   [phone]   No chief complaint on file.   History of Present Illness:  Peter Best is a 80 y.o. male with history of hypertension, HLD, longstanding RBBB, prior rheumatic heart disease with moderately thickened aortic valve no stenosis on echo 2018.  Patient last saw Dr. Anne Fu 2021 and was doing well.    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   COLONOSCOPY W/ POLYPECTOMY     UNCODED SURGICAL HISTORY   HERNIA REPAIR     INGUINAL HERNIA REPAIR     MOHS SURGERY  2018   x x  on nose   MOHS SURGERY     MOHS' CHEMOSURGERY PROCEDURE DATE: 09/30/2010:BCC R INFERIOR TEMPLE, UNCODED SURGICAL HISTORY   TONSILLECTOMY     WISDOM TOOTH EXTRACTION      Current Medications: No outpatient medications have been marked as taking for the 10/14/20 encounter (Appointment) with Dyann Kief, PA-C.   Current Facility-Administered Medications for the 10/14/20 encounter (Appointment) with Dyann Kief, PA-C  Medication   0.9 %  sodium chloride infusion     Allergies:   Patient has no known allergies.    Social History   Socioeconomic History   Marital status: Single    Spouse name: Not on file   Number of children: Not on file   Years of education: Not on file   Highest education level: Not on file  Occupational History   Not on file  Tobacco Use   Smoking status: Never   Smokeless tobacco: Never  Vaping Use   Vaping Use: Never used  Substance and Sexual Activity   Alcohol use: Never   Drug use: No   Sexual activity: Not on file  Other Topics Concern   Not on file  Social History Narrative   Not on file   Social Determinants of Health   Financial Resource Strain: Not on file  Food Insecurity: Not on file  Transportation Needs: Not on file  Physical Activity: Not on file  Stress: Not on file  Social Connections: Not on file     Family History:  The patient's ***family history is not on file.   ROS:   Please see the history of present illness.    ROS All other systems reviewed and are negative.   PHYSICAL EXAM:   VS:  There were no vitals taken for this  visit.  Physical Exam  GEN: Well nourished, well developed, in no acute distress  HEENT: normal  Neck: no JVD, carotid bruits, or masses Cardiac:RRR; no murmurs, rubs, or gallops  Respiratory:  clear to auscultation bilaterally, normal work of breathing GI: soft, nontender, nondistended, + BS Ext: without cyanosis, clubbing, or edema, Good distal pulses bilaterally MS: no deformity or atrophy  Skin: warm and dry, no rash Neuro:  Alert and Oriented x 3, Strength and sensation are intact Psych: euthymic mood, full affect  Wt Readings from Last 3 Encounters:  10/07/19 166 lb 3.2 oz (75.4 kg)  10/01/18 172 lb 6.4 oz (78.2 kg)  01/02/18 172 lb (78 kg)      Studies/Labs Reviewed:   EKG:  EKG is*** ordered today.  The ekg ordered today demonstrates ***  Recent Labs: No results found for requested labs within last 8760 hours.   Lipid Panel No results found for: CHOL, TRIG, HDL, CHOLHDL, VLDL, LDLCALC,  LDLDIRECT  Additional studies/ records that were reviewed today include:  2D echo 2018 - Left ventricle: The cavity size was normal. There was mild    concentric hypertrophy. Systolic function was normal. The    estimated ejection fraction was in the range of 55% to 60%. Wall    motion was normal; there were no regional wall motion    abnormalities. Doppler parameters are consistent with abnormal    left ventricular relaxation (grade 1 diastolic dysfunction).    There was no evidence of elevated ventricular filling pressure by    Doppler parameters.  - Aortic valve: Trileaflet; moderately thickened, moderately    calcified leaflets. There was mild regurgitation.  - Aortic root: The aortic root was normal in size.  - Mitral valve: There was no regurgitation.  - Tricuspid valve: There was no regurgitation.  - Pulmonic valve: There was mild regurgitation.  - Pulmonary arteries: Systolic pressure was within the normal    range.  - Inferior vena cava: The vessel was normal in size.  - Pericardium, extracardiac: There was no pericardial effusion.     Risk Assessment/Calculations:   {Does this patient have ATRIAL FIBRILLATION?[phone]}     ASSESSMENT:    No diagnosis found.   PLAN:  In order of problems listed above:  HTN  HLD  RBBB, longstanding, normal LVEF on echo  Prior rheumatic heart disease echo with mod thickened aortic valve, no stenosis  Shared Decision Making/Informed Consent   {Are you ordering a CV Procedure (e.g. stress test, cath, DCCV, TEE, etc)?   Press F2        :[id number]}    Medication Adjustments/Labs and Tests Ordered: Current medicines are reviewed at length with the patient today.  Concerns regarding medicines are outlined above.  Medication changes, Labs and Tests ordered today are listed in the Patient Instructions below. There are no Patient Instructions on file for this visit.   Elson Clan, PA-C  10/13/2020 2:44 PM    [facility] [address] [place], [place], [place]  [address] Phone: [phone]; Fax: [phone]

## 2020-10-14 ENCOUNTER — Ambulatory Visit: Payer: Medicare HMO | Admitting: Physician Assistant

## 2020-11-20 ENCOUNTER — Other Ambulatory Visit: Payer: Self-pay

## 2020-11-20 ENCOUNTER — Ambulatory Visit (HOSPITAL_BASED_OUTPATIENT_CLINIC_OR_DEPARTMENT_OTHER): Payer: Medicare HMO | Admitting: Family

## 2020-11-20 ENCOUNTER — Encounter (HOSPITAL_BASED_OUTPATIENT_CLINIC_OR_DEPARTMENT_OTHER): Payer: Self-pay | Admitting: Family

## 2020-11-20 VITALS — BP 124/62 | HR 97 | Ht 69.0 in | Wt 167.0 lb

## 2020-11-20 DIAGNOSIS — I4510 Unspecified right bundle-branch block: Secondary | ICD-10-CM | POA: Diagnosis not present

## 2020-11-20 DIAGNOSIS — I10 Essential (primary) hypertension: Secondary | ICD-10-CM

## 2020-11-20 DIAGNOSIS — E782 Mixed hyperlipidemia: Secondary | ICD-10-CM | POA: Diagnosis not present

## 2020-11-20 NOTE — Progress Notes (Signed)
Office Visit    Patient Name: Peter Best Date of Encounter: 11/20/2020  PCP:  Gaynelle Arabian, MD   [place]  Cardiologist:  Candee Furbish, MD  Advanced Practice Provider:  No care team member to display Electrophysiologist:  None      Chief Complaint    Peter Best is a 80 y.o. male with a hx of hypertension, hyperlipidemia, right bundle branch block, rheumatic fever as a child, anxiety presents today for follow-up of hypertension  Past Medical History    Past Medical History:  Diagnosis Date   Allergy    Anxiety disorder    BBB (bundle branch block)    Benign prostatic hyperplasia    Cancer ([place])    skin - on nose and temple   Cataract    just watching - bilateral   Gastroesophageal reflux disease    HA (headache)    otc med prn   Hyperlipidemia    Hypertensive disorder    Inguinal hernia    repaired by surgery   Insomnia    Kidney stones    passed stone - no surgery required   Major depressive disorder    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   COLONOSCOPY W/ POLYPECTOMY     UNCODED SURGICAL HISTORY   HERNIA REPAIR     INGUINAL HERNIA REPAIR     MOHS SURGERY  2018   x x  on nose   MOHS SURGERY     MOHS' CHEMOSURGERY PROCEDURE DATE: 09/30/2010:BCC R INFERIOR TEMPLE, UNCODED SURGICAL HISTORY   TONSILLECTOMY     WISDOM TOOTH EXTRACTION      Allergies  No Known Allergies  History of Present Illness    Peter Best is a 80 y.o. male with a hx of hypertension, hyperlipidemia, right bundle branch block, rheumatic fever as a child, anxiety last seen 10/07/2019 by Dr. Marlou Porch.  He previously worked in [employer] and [employer].  He lives at [facility].  Last seen 10/07/2019 doing well from a cardiac perspective.  His blood pressure was well controlled.  No changes were made.  He did have rheumatic fever as a  child and previous echocardiogram performed 07/2016 showed moderately thickened aortic valve with no stenosis. He had COVID 19 09/2019 treated with MAB.  He presents today for follow up. Enjoys walking outdoors for exercise. Endorses following a heart healthy diet.   He travelled to [place] to see his son to celebrate his second anniversary as they were unable to do a big celebration initially due to the pandemia. He enjoys walking regularly for exercise at the park near his home.  Tells me if he walks up a large hill he will feel a small amount of chest tightness which resolves on a flat road or if he takes a deep breath. No shortness of breath, dyspnea on exertion.   EKGs/Labs/Other Studies Reviewed:   The following studies were reviewed today:  Echocardiogram 07/2016 Left ventricle: The cavity size was normal. There was mild    concentric hypertrophy. Systolic function was normal. The    estimated ejection fraction was in the range of 55% to 60%. Wall    motion was normal; there were no regional wall motion    abnormalities. Doppler parameters are consistent with abnormal    left ventricular relaxation (grade 1 diastolic dysfunction).  There was no evidence of elevated ventricular filling pressure by    Doppler parameters.  - Aortic valve: Trileaflet; moderately thickened, moderately    calcified leaflets. There was mild regurgitation.  - Aortic root: The aortic root was normal in size.  - Mitral valve: There was no regurgitation.  - Tricuspid valve: There was no regurgitation.  - Pulmonic valve: There was mild regurgitation.  - Pulmonary arteries: Systolic pressure was within the normal    range.  - Inferior vena cava: The vessel was normal in size.  - Pericardium, extracardiac: There was no pericardial effusion.   EKG:  EKG is  ordered today.  The ekg ordered today demonstrates NSR 97 bpm with RBBB with no acute ST/T wave changes.   Recent Labs: No results found for requested labs  within last 8760 hours.  Recent Lipid Panel No results found for: CHOL, TRIG, HDL, CHOLHDL, VLDL, LDLCALC, LDLDIRECT   Home Medications   Current Meds  Medication Sig   acetaminophen (TYLENOL) 500 MG tablet Take 1,000 mg by mouth every 6 (six) hours as needed for mild pain.   atorvastatin (LIPITOR) 20 MG tablet Take 20 mg by mouth daily.   famotidine (PEPCID) 20 MG tablet Take 20 mg by mouth in the morning and at bedtime.   ibuprofen (ADVIL,MOTRIN) 400 MG tablet Take 400 mg by mouth every 6 (six) hours as needed for mild pain.   lisinopril (ZESTRIL) 10 MG tablet Take 10 mg by mouth daily.   omeprazole (PRILOSEC) 20 MG capsule Take 20 mg by mouth daily.   sertraline (ZOLOFT) 100 MG tablet Take 1 tablet by mouth as needed.   traZODone (DESYREL) 50 MG tablet Take 1 tablet by mouth as needed.   Current Facility-Administered Medications for the 11/20/20 encounter (Office Visit) with Loel Dubonnet, NP  Medication   0.9 %  sodium chloride infusion     Review of Systems      All other systems reviewed and are otherwise negative except as noted above.  Physical Exam    VS:  BP 124/62   Pulse 97   Ht '5\' 9"'$  (1.753 m)   Wt 167 lb (75.8 kg)   SpO2 95%   BMI 24.66 kg/m  , BMI Body mass index is 24.66 kg/m.  Wt Readings from Last 3 Encounters:  11/20/20 167 lb (75.8 kg)  10/07/19 166 lb 3.2 oz (75.4 kg)  10/01/18 172 lb 6.4 oz (78.2 kg)     GEN: Well nourished, well developed, in no acute distress. HEENT: normal. Neck: Supple, no JVD, carotid bruits, or masses. Cardiac: RRR, no rubs, or gallops. Soft, systolic murmur.  No clubbing, cyanosis, edema.  Radials/PT 2+ and equal bilaterally.  Respiratory:  Respirations regular and unlabored, clear to auscultation bilaterally. GI: Soft, nontender, nondistended. MS: No deformity or atrophy. Skin: Warm and dry, no rash. Neuro:  Strength and sensation are intact. Psych: Normal affect.  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 Stable soft systolic murmur on exam. No syncope, shortness of breath. Reports only very mild chest discomfort if he walks up a sharp incline - this does not occur with his regular 1-3 mile walks. We discussed that this was not of concern and he will notify us if it starts happening with less activity. No indication for repeat echo at this time. Continue optimal BP control.   HTN - BP well controlled. Continue current antihypertensive regimen which is managed by his  primary care provider.   RBBB - Stable finding by EKG today. Asymptomatic with no lightheadedness, dizziness, near syncope, syncope.   HLD - Continue Atorvastatin '20mg'$  daily.   Disposition: Follow up in 1 year(s) with Dr. Marlou Porch or APP.  Signed, Loel Dubonnet, NP 11/20/2020, 2:07 PM [place]

## 2020-11-20 NOTE — Patient Instructions (Signed)
Medication Instructions:  Continue your current medications.   *If you need a refill on your cardiac medications before your next appointment, please call your pharmacy*   Lab Work: None ordered today.   Testing/Procedures: Your EKG today showed normal sinus rhythm which is a good result.    Follow-Up: At [facility], you and your health needs are our priority.  As part of our continuing mission to provide you with exceptional heart care, we have created designated Provider Care Teams.  These Care Teams include your primary Cardiologist (physician) and Advanced Practice Providers (APPs -  Physician Assistants and Nurse Practitioners) who all work together to provide you with the care you need, when you need it.  We recommend signing up for the patient portal called "MyChart".  Sign up information is provided on this After Visit Summary.  MyChart is used to connect with patients for Virtual Visits (Telemedicine).  Patients are able to view lab/test results, encounter notes, upcoming appointments, etc.  Non-urgent messages can be sent to your provider as well.   To learn more about what you can do with MyChart, go to [email or web address].    Your next appointment:   1 year(s)  The format for your next appointment:   In Person  Provider:   Candee Furbish, MD   Other Instructions  Heart Healthy Diet Recommendations: A low-salt diet is recommended. Meats should be grilled, baked, or boiled. Avoid fried foods. Focus on lean protein sources like fish or chicken with vegetables and fruits. The American Heart Association is a Microbiologist!    Exercise recommendations: The American Heart Association recommends 150 minutes of moderate intensity exercise weekly. Try 30 minutes of moderate intensity exercise 4-5 times per week. This could include walking, jogging, or swimming.

## 2020-12-05 ENCOUNTER — Emergency Department (HOSPITAL_COMMUNITY)
Admission: EM | Admit: 2020-12-05 | Discharge: 2020-12-05 | Disposition: A | Discharge status: Home / Self Care | Payer: Medicare HMO | Attending: Emergency Medicine | Admitting: Emergency Medicine

## 2020-12-05 ENCOUNTER — Encounter (HOSPITAL_COMMUNITY): Payer: Self-pay

## 2020-12-05 ENCOUNTER — Other Ambulatory Visit: Payer: Self-pay

## 2020-12-05 DIAGNOSIS — Z85828 Personal history of other malignant neoplasm of skin: Secondary | ICD-10-CM | POA: Diagnosis not present

## 2020-12-05 DIAGNOSIS — R42 Dizziness and giddiness: Secondary | ICD-10-CM | POA: Diagnosis not present

## 2020-12-05 DIAGNOSIS — R110 Nausea: Secondary | ICD-10-CM | POA: Diagnosis not present

## 2020-12-05 DIAGNOSIS — R231 Pallor: Secondary | ICD-10-CM | POA: Diagnosis not present

## 2020-12-05 DIAGNOSIS — Z79899 Other long term (current) drug therapy: Secondary | ICD-10-CM | POA: Diagnosis not present

## 2020-12-05 DIAGNOSIS — R55 Syncope and collapse: Secondary | ICD-10-CM

## 2020-12-05 DIAGNOSIS — I4510 Unspecified right bundle-branch block: Secondary | ICD-10-CM | POA: Insufficient documentation

## 2020-12-05 DIAGNOSIS — I959 Hypotension, unspecified: Secondary | ICD-10-CM | POA: Diagnosis not present

## 2020-12-05 DIAGNOSIS — Z743 Need for continuous supervision: Secondary | ICD-10-CM | POA: Diagnosis not present

## 2020-12-05 DIAGNOSIS — R61 Generalized hyperhidrosis: Secondary | ICD-10-CM | POA: Diagnosis not present

## 2020-12-05 DIAGNOSIS — M549 Dorsalgia, unspecified: Secondary | ICD-10-CM | POA: Diagnosis not present

## 2020-12-05 DIAGNOSIS — R000 Tachycardia, unspecified: Secondary | ICD-10-CM | POA: Diagnosis not present

## 2020-12-05 DIAGNOSIS — I10 Essential (primary) hypertension: Secondary | ICD-10-CM | POA: Diagnosis not present

## 2020-12-05 LAB — COMPREHENSIVE METABOLIC PANEL
ALT: 13 U/L (ref 0–44)
AST: 18 U/L (ref 15–41)
Albumin: 4.4 g/dL (ref 3.5–5.0)
Alkaline Phosphatase: 47 U/L (ref 38–126)
Anion gap: 7 (ref 5–15)
BUN: 17 mg/dL (ref 8–23)
CO2: 26 mmol/L (ref 22–32)
Calcium: 9 mg/dL (ref 8.9–10.3)
Chloride: 106 mmol/L (ref 98–111)
Creatinine, Ser: 0.78 mg/dL (ref 0.61–1.24)
GFR, Estimated: >60 mL/min (ref >60)
Glucose, Bld: 94 mg/dL (ref 70–99)
Potassium: 3.8 mmol/L (ref 3.5–5.1)
Sodium: 139 mmol/L (ref 135–145)
Total Bilirubin: 1 mg/dL (ref 0.3–1.2)
Total Protein: 6.9 g/dL (ref 6.5–8.1)

## 2020-12-05 LAB — CBC WITH DIFFERENTIAL/PLATELET
Abs Immature Granulocytes: 0.04 10*3/uL (ref 0.00–0.07)
Basophils Absolute: 0 10*3/uL (ref 0.0–0.1)
Basophils Relative: 0 %
Eosinophils Absolute: 0 10*3/uL (ref 0.0–0.5)
Eosinophils Relative: 0 %
HCT: 46.5 % (ref 39.0–52.0)
Hemoglobin: 15.4 g/dL (ref 13.0–17.0)
Immature Granulocytes: 1 %
Lymphocytes Relative: 16 %
Lymphs Abs: 1.2 10*3/uL (ref 0.7–4.0)
MCH: 31.1 pg (ref 26.0–34.0)
MCHC: 33.1 g/dL (ref 30.0–36.0)
MCV: 93.9 fL (ref 80.0–100.0)
Monocytes Absolute: 0.6 10*3/uL (ref 0.1–1.0)
Monocytes Relative: 8 %
Neutro Abs: 5.6 10*3/uL (ref 1.7–7.7)
Neutrophils Relative %: 75 %
Platelets: 174 10*3/uL (ref 150–400)
RBC: 4.95 MIL/uL (ref 4.22–5.81)
RDW: 12.6 % (ref 11.5–15.5)
WBC: 7.5 10*3/uL (ref 4.0–10.5)
nRBC: 0 % (ref 0.0–0.2)

## 2020-12-05 LAB — CBG MONITORING, ED: Glucose-Capillary: 88 mg/dL (ref 70–99)

## 2020-12-05 NOTE — ED Triage Notes (Signed)
Patient [place] ems from church. Patient was packing boxes for about 1.5 hrs with his "bad back" when he states he felt nauseous, light headed, and almost passed out. States he has not ate or drank much today. Per ems palpated BP was SBP 80. 566ml fluids made him feel better.

## 2020-12-05 NOTE — ED Provider Notes (Signed)
[facility] DEPT Provider Note   CSN: [id number] Arrival date & time: 12/05/20  1223     History Chief Complaint  Patient presents with   Near Syncope    Peter Best is a 80 y.o. male.  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  He developed pain in the center of his back which is typical of his usual back pain.  He decided to sit down.  When he went to stand up, he started to feel nauseous, lightheaded, and he looked pale per a registered nurse who was also at the event.  His blood pressure was low, and he had IV fluids administered per EMS.  He reports that he has urinary frequency, and he tries to limit his fluid intake in order to avoid having to run back and forth to the bathroom.  At no point did he have any palpitations, chest pain, neurologic symptoms.  He is currently back to baseline.  The history is provided by the patient.  Near Syncope This is a new problem. The current episode started less than 1 hour ago. Episode frequency: one episode. The problem has been resolved. Pertinent negatives include no chest pain, no abdominal pain, no headaches and no shortness of breath. Nothing aggravates the symptoms. Nothing relieves the symptoms. He has tried nothing for the symptoms. The treatment provided no relief.      Past Medical History:  Diagnosis Date   Allergy    Anxiety disorder    BBB (bundle branch block)    Benign prostatic hyperplasia    Cancer ([facility])    skin - on nose and temple   Cataract    just watching - bilateral   Gastroesophageal reflux disease    HA (headache)    otc med prn   Hyperlipidemia    Hypertensive disorder    Inguinal hernia    repaired by surgery   Insomnia    Kidney stones    passed stone - no surgery required   Major depressive disorder    Osteoarthritis    OF THE HIP and mid back    Patient Active Problem List   Diagnosis Date Noted   Right bundle branch  block 10/01/2018   Essential hypertension 10/01/2018   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   COLONOSCOPY W/ POLYPECTOMY     UNCODED SURGICAL HISTORY   HERNIA REPAIR     INGUINAL HERNIA REPAIR     MOHS SURGERY  2018   x x  on nose   MOHS SURGERY     MOHS' CHEMOSURGERY PROCEDURE DATE: 09/30/2010:BCC R INFERIOR TEMPLE, UNCODED SURGICAL HISTORY   TONSILLECTOMY     WISDOM TOOTH EXTRACTION         Family History  Problem Relation Age of Onset   Colon polyps Neg Hx    Rectal cancer Neg Hx    Stomach cancer Neg Hx     Social History   Tobacco Use   Smoking status: Never   Smokeless tobacco: Never  Vaping Use   Vaping Use: Never used  Substance Use Topics   Alcohol use: Never   Drug use: No    Home Medications Prior to Admission medications   Medication Sig Start Date End Date Taking? Authorizing Provider  acetaminophen (TYLENOL) 500 MG tablet Take 1,000 mg by mouth every  6 (six) hours as needed for mild pain.    [provider]  atorvastatin (LIPITOR) 20 MG tablet Take 20 mg by mouth daily. 05/06/19   [provider]  famotidine (PEPCID) 20 MG tablet Take 20 mg by mouth in the morning and at bedtime. 11/11/20   [provider]  ibuprofen (ADVIL,MOTRIN) 400 MG tablet Take 400 mg by mouth every 6 (six) hours as needed for mild pain.    [provider]  lisinopril (ZESTRIL) 10 MG tablet Take 10 mg by mouth daily.    [provider]  omeprazole (PRILOSEC) 20 MG capsule Take 20 mg by mouth daily.    [provider]  sertraline (ZOLOFT) 100 MG tablet Take 1 tablet by mouth as needed.    [provider]  traZODone (DESYREL) 50 MG tablet Take 1 tablet by mouth as needed.    [provider]    Allergies    Patient has no known allergies.  Review of Systems   Review of Systems   Constitutional:  Negative for chills and fever.  HENT:  Negative for ear pain and sore throat.   Eyes:  Negative for pain and visual disturbance.  Respiratory:  Negative for cough and shortness of breath.   Cardiovascular:  Positive for near-syncope. Negative for chest pain and palpitations.  Gastrointestinal:  Positive for nausea. Negative for abdominal pain and vomiting.  Genitourinary:  Negative for dysuria and hematuria.  Musculoskeletal:  Positive for back pain. Negative for arthralgias.  Skin:  Negative for color change and rash.  Neurological:  Positive for light-headedness. Negative for seizures, syncope and headaches.  All other systems reviewed and are negative.  Physical Exam Updated Vital Signs BP 132/77 (BP Location: Left Arm)   Pulse 71   Temp 97.9 F (36.6 C) (Oral)   Resp 15   Ht 5\' 10"  (1.778 m)   Wt 75.3 kg   SpO2 100%   BMI 23.82 kg/m   Physical Exam Vitals and nursing note reviewed.  Constitutional:      Appearance: Normal appearance.  HENT:     Head: Normocephalic and atraumatic.  Cardiovascular:     Rate and Rhythm: Normal rate and regular rhythm.     Heart sounds: Normal heart sounds.  Pulmonary:     Effort: Pulmonary effort is normal.     Breath sounds: Normal breath sounds.  Musculoskeletal:     Cervical back: Normal range of motion.     Right lower leg: No edema.     Left lower leg: No edema.  Skin:    General: Skin is warm and dry.  Neurological:     General: No focal deficit present.     Mental Status: He is alert and oriented to person, place, and time.     Cranial Nerves: No cranial nerve deficit.  Psychiatric:        Mood and Affect: Mood normal.        Behavior: Behavior normal.    ED Results / Procedures / Treatments   Labs (all labs ordered are listed, but only abnormal results are displayed) Labs Reviewed  COMPREHENSIVE METABOLIC PANEL  CBC WITH DIFFERENTIAL/PLATELET  CBG MONITORING, ED    EKG EKG  Interpretation  Date/Time:  Saturday December 05 2020 12:50:56 EDT Ventricular Rate:  71 PR Interval:  160 QRS Duration: 150 QT Interval:  428 QTC Calculation: 466 R Axis:   86 Text Interpretation: Sinus rhythm Right bundle branch block no acute ischemia Confirmed by Joya Gaskins,  Vicente Males [id number]) on 12/05/2020 1:27:46 PM  Radiology No results found.  Procedures Procedures   Medications Ordered in ED Medications - No data to display  ED Course  I have reviewed the triage vital signs and the nursing notes.  Pertinent labs & imaging results that were available during my care of the patient were reviewed by me and considered in my medical decision making (see chart for details).    MDM Rules/Calculators/A&P                           Beverlyn Roux resents after an episode of near syncope.  Based on his history, it seems likely it was a combination of factors contributing to this including pain from his back, positional changes, and possibly dehydration.  No suggestion of cardiogenic or neurogenic syncope.  ED work-up within normal limits including electrolytes.  He is well-appearing and at baseline.  He will be discharged home. Final Clinical Impression(s) / ED Diagnoses Final diagnoses:  Near syncope    Rx / DC Orders ED Discharge Orders     None        Arnaldo Natal, MD 12/05/20 1435

## 2020-12-23 DIAGNOSIS — M545 Low back pain, unspecified: Secondary | ICD-10-CM | POA: Diagnosis not present

## 2020-12-30 DIAGNOSIS — M545 Low back pain, unspecified: Secondary | ICD-10-CM | POA: Diagnosis not present

## 2021-01-05 ENCOUNTER — Emergency Department (HOSPITAL_BASED_OUTPATIENT_CLINIC_OR_DEPARTMENT_OTHER): Payer: Medicare HMO | Admitting: Radiology

## 2021-01-05 ENCOUNTER — Emergency Department (HOSPITAL_BASED_OUTPATIENT_CLINIC_OR_DEPARTMENT_OTHER): Payer: Medicare HMO

## 2021-01-05 ENCOUNTER — Emergency Department (HOSPITAL_BASED_OUTPATIENT_CLINIC_OR_DEPARTMENT_OTHER)
Admission: EM | Admit: 2021-01-05 | Discharge: 2021-01-05 | Disposition: A | Discharge status: Home / Self Care | Payer: Medicare HMO | Attending: Emergency Medicine | Admitting: Emergency Medicine

## 2021-01-05 ENCOUNTER — Other Ambulatory Visit: Payer: Self-pay

## 2021-01-05 ENCOUNTER — Encounter (HOSPITAL_BASED_OUTPATIENT_CLINIC_OR_DEPARTMENT_OTHER): Payer: Self-pay

## 2021-01-05 DIAGNOSIS — I10 Essential (primary) hypertension: Secondary | ICD-10-CM | POA: Diagnosis not present

## 2021-01-05 DIAGNOSIS — R42 Dizziness and giddiness: Secondary | ICD-10-CM | POA: Insufficient documentation

## 2021-01-05 DIAGNOSIS — Y92481 Parking lot as the place of occurrence of the external cause: Secondary | ICD-10-CM | POA: Insufficient documentation

## 2021-01-05 DIAGNOSIS — R350 Frequency of micturition: Secondary | ICD-10-CM | POA: Diagnosis not present

## 2021-01-05 DIAGNOSIS — W101XXA Fall (on)(from) sidewalk curb, initial encounter: Secondary | ICD-10-CM | POA: Insufficient documentation

## 2021-01-05 DIAGNOSIS — Z85828 Personal history of other malignant neoplasm of skin: Secondary | ICD-10-CM | POA: Insufficient documentation

## 2021-01-05 DIAGNOSIS — S52121A Displaced fracture of head of right radius, initial encounter for closed fracture: Secondary | ICD-10-CM | POA: Diagnosis not present

## 2021-01-05 DIAGNOSIS — W19XXXA Unspecified fall, initial encounter: Secondary | ICD-10-CM

## 2021-01-05 DIAGNOSIS — Z743 Need for continuous supervision: Secondary | ICD-10-CM | POA: Diagnosis not present

## 2021-01-05 DIAGNOSIS — S0990XA Unspecified injury of head, initial encounter: Secondary | ICD-10-CM | POA: Diagnosis not present

## 2021-01-05 DIAGNOSIS — R110 Nausea: Secondary | ICD-10-CM | POA: Diagnosis not present

## 2021-01-05 DIAGNOSIS — S52124A Nondisplaced fracture of head of right radius, initial encounter for closed fracture: Secondary | ICD-10-CM | POA: Insufficient documentation

## 2021-01-05 DIAGNOSIS — Z79899 Other long term (current) drug therapy: Secondary | ICD-10-CM | POA: Diagnosis not present

## 2021-01-05 DIAGNOSIS — R413 Other amnesia: Secondary | ICD-10-CM | POA: Diagnosis not present

## 2021-01-05 DIAGNOSIS — N401 Enlarged prostate with lower urinary tract symptoms: Secondary | ICD-10-CM | POA: Diagnosis not present

## 2021-01-05 DIAGNOSIS — R635 Abnormal weight gain: Secondary | ICD-10-CM | POA: Diagnosis not present

## 2021-01-05 DIAGNOSIS — S6991XA Unspecified injury of right wrist, hand and finger(s), initial encounter: Secondary | ICD-10-CM | POA: Diagnosis not present

## 2021-01-05 LAB — CBG MONITORING, ED: Glucose-Capillary: 110 mg/dL — ABNORMAL HIGH (ref 70–99)

## 2021-01-05 MED ORDER — HYDROCODONE-ACETAMINOPHEN 5-325 MG PO TABS: 0.5000 | ORAL_TABLET | Freq: Four times a day (QID) | ORAL | 0 refills | Status: DC | PRN

## 2021-01-05 NOTE — Discharge Instructions (Signed)
Continue taking home medications as prescribed. Use Tylenol ibuprofen as needed for mild to moderate pain. Use Norco as needed for severe breakthrough pain.  Have caution, this may make you tired or groggy and may increase your risk of falls. You may start with a half a tablet, increase to 1 whole tablet as needed for pain control. Make sure you take this medicine with food. Call your orthopedic doctor to set up a follow-up appointment. Return to the emergency room if develop severe worsening pain, numbness in your hand, or any new, worsening, or concerning symptoms.

## 2021-01-05 NOTE — ED Triage Notes (Signed)
Pt brought in by EMS for [place] shopping [place] parking [place], tripped. Denies LOC, denies head injury. Then light headed, nauseated. Now diaphoretic, pale and light headed in triage.

## 2021-01-05 NOTE — ED Notes (Signed)
Pt's CBG result was 110. Informed Sophia, PA.

## 2021-01-05 NOTE — ED Provider Notes (Signed)
[facility] DEPT Provider Note   CSN: [id number] Arrival date & time: 01/05/21  1229     History Chief Complaint  Patient presents with   Peter Best is a 80 y.o. male presenting for evaluation after fall.  Patient states he was stepping up off a curb when he caught his foot on the curb, falling forward.  He caught himself with both arms, mostly his right elbow.  He did not hit his head or lose consciousness.  However immediately after falling, patient felt lightheaded and nauseous.  As he was not feeling well, he came to the ER for evaluation.  However at this time, patient states he is feeling very well.  No dizziness, lightheadedness, nausea, headache, chest pain, shortness of breath, chest or abdominal pain.  He is having some pain in his right elbow.  He has bruising of both palms, but no pain in his hands or wrist.  He is not on any blood thinners.  He has been able to ambulate since.  He has not taken anything for his symptoms.  Additional history obtained from chart review, patient with a history of bundle branch block, GERD, hypertension, hyperlipidemia  HPI     Past Medical History:  Diagnosis Date   Allergy    Anxiety disorder    BBB (bundle branch block)    Benign prostatic hyperplasia    Cancer (HCC)    skin - on nose and temple   Cataract    just watching - bilateral   Gastroesophageal reflux disease    HA (headache)    otc med prn   Hyperlipidemia    Hypertensive disorder    Inguinal hernia    repaired by surgery   Insomnia    Kidney stones    passed stone - no surgery required   Major depressive disorder    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   COLONOSCOPY W/ POLYPECTOMY     UNCODED SURGICAL HISTORY   HERNIA REPAIR     INGUINAL HERNIA REPAIR     MOHS SURGERY  2018   x x  on nose   MOHS SURGERY     MOHS' CHEMOSURGERY PROCEDURE DATE: 09/30/2010:BCC R INFERIOR TEMPLE, UNCODED SURGICAL HISTORY   TONSILLECTOMY     WISDOM TOOTH EXTRACTION         Family History  Problem Relation Age of Onset   Colon polyps Neg Hx    Rectal cancer Neg Hx    Stomach cancer Neg Hx     Social History   Tobacco Use   Smoking status: Never    Passive exposure: Never   Smokeless tobacco: Never  Vaping Use   Vaping Use: Never used  Substance Use Topics   Alcohol use: Never   Drug use: No    Home Medications Prior to Admission medications   Medication Sig Start Date End Date Taking? Authorizing Provider  HYDROcodone-acetaminophen (NORCO/VICODIN) 5-325 MG tablet Take 0.5-1 tablets by mouth every 6 (six) hours as needed for severe pain. 01/05/21  Yes Jett Kulzer, PA-C  acetaminophen (TYLENOL) 500 MG tablet Take 1,000 mg by mouth every 6 (six)  hours as needed for mild pain.    [provider]  atorvastatin (LIPITOR) 20 MG tablet Take 20 mg by mouth daily. 05/06/19   [provider]  famotidine (PEPCID) 20 MG tablet Take 20 mg by mouth in the morning and at bedtime. 11/11/20   [provider]  ibuprofen (ADVIL,MOTRIN) 400 MG tablet Take 400 mg by mouth every 6 (six) hours as needed for mild pain.    [provider]  lisinopril (ZESTRIL) 10 MG tablet Take 10 mg by mouth daily.    [provider]  omeprazole (PRILOSEC) 20 MG capsule Take 20 mg by mouth daily.    [provider]  sertraline (ZOLOFT) 100 MG tablet Take 1 tablet by mouth as needed.    [provider]  traZODone (DESYREL) 50 MG tablet Take 1 tablet by mouth as needed.    [provider]    Allergies    Patient has no known allergies.  Review of Systems   Review of Systems   Gastrointestinal:  Positive for nausea (resolved).  Musculoskeletal:  Positive for arthralgias.  Neurological:  Positive for light-headedness (resolved).  All other systems reviewed and are negative.  Physical Exam Updated Vital Signs BP (!) 145/84 (BP Location: Right Arm)   Pulse 82   Temp 98.3 F (36.8 C) (Oral)   Resp 18   Ht 5' 9.5" (1.765 m)   Wt 77.1 kg   SpO2 99%   BMI 24.74 kg/m   Physical Exam Vitals and nursing note reviewed.  Constitutional:      General: He is not in acute distress.    Appearance: Normal appearance.  HENT:     Head: Normocephalic and atraumatic.  Eyes:     Conjunctiva/sclera: Conjunctivae normal.     Pupils: Pupils are equal, round, and reactive to light.  Neck:     Comments: No TTP of midline C-spine Cardiovascular:     Rate and Rhythm: Normal rate and regular rhythm.     Pulses: Normal pulses.  Pulmonary:     Effort: Pulmonary effort is normal. No respiratory distress.     Breath sounds: Normal breath sounds. No wheezing.     Comments: Speaking in full sentences.  Clear lung sounds in all fields. Abdominal:     General: There is no distension.     Palpations: Abdomen is soft. There is no mass.     Tenderness: There is no abdominal tenderness. There is no guarding or rebound.  Musculoskeletal:        General: Tenderness present. Normal range of motion.     Cervical back: Normal range of motion and neck supple.     Comments: Mild tenderness palpation of the posterior right elbow over the olecranon.  No obvious deformity.  Full active range of motion with mild discomfort.  Grip strength equal bilaterally.  Radial pulses 2+ bilaterally.  Contusions noted on the palms of both hands, however full active range of motion of both wrists and all fingers without difficulty.  No tenderness to palpation of midline spine.  Skin:    General: Skin is warm and dry.     Capillary Refill: Capillary refill takes less than 2 seconds.  Neurological:      Mental Status: He is alert and oriented to person, place, and time.  Psychiatric:        Mood and Affect: Mood and affect normal.        Speech: Speech normal.  Behavior: Behavior normal.    ED Results / Procedures / Treatments   Labs (all labs ordered are listed, but only abnormal results are displayed) Labs Reviewed  CBG MONITORING, ED - Abnormal; Notable for the following components:      Result Value   Glucose-Capillary 110 (*)    All other components within normal limits    EKG EKG Interpretation  Date/Time:  Tuesday January 05 2021 12:36:03 EDT Ventricular Rate:  83 PR Interval:  162 QRS Duration: 132 QT Interval:  390 QTC Calculation: 458 R Axis:   195 Text Interpretation: Normal sinus rhythm Right bundle branch block T wave abnormality, consider inferior ischemia Abnormal ECG Confirmed by Godfrey Pick [phone]) on 01/05/2021 1:28:00 PM  Radiology DG Elbow Complete Right  Result Date: 01/05/2021 CLINICAL DATA:  Fall, right elbow pain EXAM: RIGHT ELBOW - COMPLETE 3+ VIEW COMPARISON:  None. FINDINGS: Acute minimally depressed fracture of the radial head and neck. 6 mm triangular osseous density projects within the anterior joint space. Exact donor site is uncertain, but may be related to the coronoid process. Evaluation is slightly limited by patient rotation on lateral view. There is a large elbow joint hemarthrosis. Osseous alignment is maintained without dislocation. IMPRESSION: 1. Acute minimally depressed fracture of the radial head and neck. 2. 6 mm triangular osseous density within the anterior joint space, possibly related to the coronoid process. 3. Large elbow joint hemarthrosis. Electronically Signed   By: Davina Poke D.O.   On: 01/05/2021 13:42   CT Head Wo Contrast  Result Date: 01/05/2021 CLINICAL DATA:  Fall.  Head trauma, minor (Age >= 65y) EXAM: CT HEAD WITHOUT CONTRAST TECHNIQUE: Contiguous axial images were obtained from the base of the skull  through the vertex without intravenous contrast. COMPARISON:  None. FINDINGS: Brain: No evidence of acute infarction, hemorrhage, hydrocephalus, or extra-axial collection. There is a rounded fat attenuation mass near the cerebellopontine angle on the left measuring 13 x 9 x 11 mm (series 2, image 6). Scattered low-density changes within the periventricular and subcortical white matter compatible with chronic microvascular ischemic change. Mild diffuse cerebral volume loss. Vascular: Atherosclerotic calcifications involving the large vessels of the skull base. No unexpected hyperdense vessel. Skull: Normal. Negative for fracture or focal lesion. Sinuses/Orbits: No acute finding. Other: None. IMPRESSION: 1. No acute intracranial findings. 2. 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 3. 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 Comparison with prior outside imaging of the brain is recommended to establish stability of this finding. Electronically Signed   By: Davina Poke D.O.   On: 01/05/2021 13:39    Procedures Procedures   Medications Ordered in ED Medications - No data to display  ED Course  I have reviewed the triage vital signs and the nursing notes.  Pertinent labs & imaging results that were available during my care of the patient were reviewed by me and considered in my medical decision making (see chart for details).    MDM Rules/Calculators/A&P                           Patient presenting for evaluation after mechanical fall.  On exam, patient appears nontoxic.  He is neurovascular intact.  Initially after the fall, he had lightheadedness and nausea, but this is since resolved.  He has been able to ambulate since.  His pain is right elbow, obtain  x-rays in the area.  Due to the patient's symptoms after the fall, will obtain head CT given that there is no head trauma considering his  age.  Offered x-rays of the hands due to bruising in this area, patient declined.  CT head negative for acute findings.  Shows lipoma, discussed results with patient who is aware of this and follows with his primary care doctor regarding this.  X-ray of the elbow shows a depressed radial head fracture.  Associated hemarthrosis consistent with injury.  Will place in a sling and have patient follow-up with orthopedics.  Discussed findings with patient.  Discussed treatment with ice, Tylenol, ibuprofen, and Norco as needed for severe breakthrough pain.  Discussed risks of Norco.  At this time, patient appears safe for discharge.  Return precautions given.  Patient states he understands and agrees to plan  Final Clinical Impression(s) / ED Diagnoses Final diagnoses:  Closed nondisplaced fracture of head of right radius, initial encounter  Fall, initial encounter    Rx / DC Orders ED Discharge Orders          Ordered    HYDROcodone-acetaminophen (NORCO/VICODIN) 5-325 MG tablet  Every 6 hours PRN        01/05/21 1401             Kenidee Cregan, PA-C 01/05/21 1409    Godfrey Pick, MD 01/07/21 [phone]

## 2021-01-08 DIAGNOSIS — S52121A Displaced fracture of head of right radius, initial encounter for closed fracture: Secondary | ICD-10-CM | POA: Diagnosis not present

## 2021-01-11 ENCOUNTER — Other Ambulatory Visit: Payer: Self-pay

## 2021-01-11 ENCOUNTER — Ambulatory Visit: Payer: Medicare HMO | Admitting: Orthopedic Surgery

## 2021-01-11 ENCOUNTER — Encounter: Payer: Self-pay | Admitting: Orthopedic Surgery

## 2021-01-11 ENCOUNTER — Ambulatory Visit (INDEPENDENT_AMBULATORY_CARE_PROVIDER_SITE_OTHER): Payer: Medicare HMO

## 2021-01-11 VITALS — BP 139/67 | HR 88 | Ht 69.0 in | Wt 173.8 lb

## 2021-01-11 DIAGNOSIS — S52124A Nondisplaced fracture of head of right radius, initial encounter for closed fracture: Secondary | ICD-10-CM

## 2021-01-11 DIAGNOSIS — S52121A Displaced fracture of head of right radius, initial encounter for closed fracture: Secondary | ICD-10-CM

## 2021-01-11 DIAGNOSIS — M79641 Pain in right hand: Secondary | ICD-10-CM

## 2021-01-11 DIAGNOSIS — M25521 Pain in right elbow: Secondary | ICD-10-CM

## 2021-01-11 HISTORY — DX: Displaced fracture of head of right radius, initial encounter for closed fracture: S52.121A

## 2021-01-11 NOTE — Progress Notes (Signed)
Office Visit Note   Patient: Peter Best           Date of Birth: Jun 24, 1940           MRN: [id number] Visit Date: 01/11/2021              Requested by: [name], MD [address] [place],  [place] [address] PCP: [name], MD   Assessment & Plan: Visit Diagnoses:  1. Pain in right elbow   2. Pain in right hand   3. Closed nondisplaced fracture of head of right radius, initial encounter     Plan: We discussed the diagnosis, prognosis, non-operative and operative treatment options for his radial head fracture and questionable coronoid tip versus osteophyte fracture in the setting of a concentric elbow without evidence of instability.  We discussed the importance of preventing elbow stiffness.   After our discussion, the patient would like to proceed with nonoperative treatment with early range of motion.  We reviewed the risks and benefits of conservative management.  The patient expressed understanding of the reasoning and strategy going forward.  All patient questions and concerns were addressed.    Follow-Up Instructions: No follow-ups on file.   Orders:  Orders Placed This Encounter  Procedures   XR Elbow Complete Right (3+View)   XR Hand Complete Right   No orders of the defined types were placed in this encounter.     Procedures: No procedures performed   Clinical Data: No additional findings.   Subjective: Chief Complaint  Patient presents with   Right Elbow - New Patient (Initial Visit)    This is an 80 year old right-hand-dominant male who presents with pain in the right elbow after ground-level fall last week.  He was walking across a parking lot when he tripped over an elevated curb that he did not see.  He braced himself with both of his arms and took the brunt of the force with his right arm.  He describes pain in the right elbow that is worse with terminal flexion or extension.  He was given a sling in the ER but discarded  it almost immediately.  His pain and swelling are improved somewhat from the date of injury.  He still describes moderate swelling of the elbow with ecchymosis from the elbow down the forearm.  He has pain in the swelling of the dorsum of his hand.  He denies any numbness or paresthesias.  He denies any subjective instability with using this elbow.  He is taking ibuprofen as needed for pain.   Review of Systems   Objective: Vital Signs: BP 139/67 (BP Location: Left Arm, Patient Position: Sitting, Cuff Size: Normal)   Pulse 88   Ht 5\' 9"  (1.753 m)   Wt 173 lb 12.8 oz (78.8 kg)   SpO2 94%   BMI 25.67 kg/m   Physical Exam Constitutional:      Appearance: Normal appearance.  Cardiovascular:     Rate and Rhythm: Normal rate.     Pulses: Normal pulses.  Pulmonary:     Effort: Pulmonary effort is normal.  Skin:    General: Skin is warm and dry.     Capillary Refill: Capillary refill takes less than 2 seconds.  Neurological:     Mental Status: He is alert.    Right Elbow Exam   Tenderness  Right elbow tenderness location: Diffuse tenderness w/ mild ecchymosis.   Muscle Strength  The patient has normal right elbow strength.  Other  Erythema: absent Sensation: normal Pulse: present  Comments:  ROM from approximately 15-100 degrees.  Pain w/ terminal extension and flexion.  Full and painless pronation/supination.  5/5 strength with EF and EE.      Specialty Comments:  No specialty comments available.  Imaging: Multiple views of the right elbow taken today reviewed interpreted by me.  They demonstrate a minimally displaced fracture through the radial head.  There is a posterior fracture fragment which does not have an obvious donor but may have come from a fractured enthesophyte off of the olecranon.  There may be a small coronoid tip fracture that is difficult to fully appreciate on the views.  The elbow is concentrically reduced with no evidence of instability.  Multiple  views of the right hand also taken today reviewed by me.  They demonstrate no evidence of acute fracture with some dorsal soft tissue swelling.   PMFS History: Patient Active Problem List   Diagnosis Date Noted   Fracture of radial head, right, closed 01/11/2021   Right bundle branch block 10/01/2018   Essential hypertension 10/01/2018   Pure hypertriglyceridemia 10/01/2018   Past Medical History:  Diagnosis Date   Allergy    Anxiety disorder    BBB (bundle branch block)    Benign prostatic hyperplasia    Cancer (HCC)    skin - on nose and temple   Cataract    just watching - bilateral   Gastroesophageal reflux disease    HA (headache)    otc med prn   Hyperlipidemia    Hypertensive disorder    Inguinal hernia    repaired by surgery   Insomnia    Kidney stones    passed stone - no surgery required   Major depressive disorder    Osteoarthritis    OF THE HIP and mid back    Family History  Problem Relation Age of Onset   Colon polyps Neg Hx    Rectal cancer Neg Hx    Stomach cancer Neg Hx     Past Surgical History:  Procedure Laterality Date   COLONOSCOPY W/ POLYPECTOMY  09/2014   polyps  - done in [place]   COLONOSCOPY W/ POLYPECTOMY     UNCODED SURGICAL HISTORY   HERNIA REPAIR     INGUINAL HERNIA REPAIR     MOHS SURGERY  2018   x x  on nose   MOHS SURGERY     MOHS' CHEMOSURGERY PROCEDURE DATE: 09/30/2010:BCC R INFERIOR TEMPLE, UNCODED SURGICAL HISTORY   TONSILLECTOMY     WISDOM TOOTH EXTRACTION     Social History   Occupational History   Not on file  Tobacco Use   Smoking status: Never    Passive exposure: Never   Smokeless tobacco: Never  Vaping Use   Vaping Use: Never used  Substance and Sexual Activity   Alcohol use: Never   Drug use: No   Sexual activity: Not on file

## 2021-01-12 DIAGNOSIS — G8929 Other chronic pain: Secondary | ICD-10-CM | POA: Diagnosis not present

## 2021-01-12 DIAGNOSIS — R69 Illness, unspecified: Secondary | ICD-10-CM | POA: Diagnosis not present

## 2021-01-12 DIAGNOSIS — Z85820 Personal history of malignant melanoma of skin: Secondary | ICD-10-CM | POA: Diagnosis not present

## 2021-01-12 DIAGNOSIS — N400 Enlarged prostate without lower urinary tract symptoms: Secondary | ICD-10-CM | POA: Diagnosis not present

## 2021-01-12 DIAGNOSIS — G4700 Insomnia, unspecified: Secondary | ICD-10-CM | POA: Diagnosis not present

## 2021-01-12 DIAGNOSIS — Z823 Family history of stroke: Secondary | ICD-10-CM | POA: Diagnosis not present

## 2021-01-12 DIAGNOSIS — F411 Generalized anxiety disorder: Secondary | ICD-10-CM | POA: Diagnosis not present

## 2021-01-12 DIAGNOSIS — K219 Gastro-esophageal reflux disease without esophagitis: Secondary | ICD-10-CM | POA: Diagnosis not present

## 2021-01-12 DIAGNOSIS — M1990 Unspecified osteoarthritis, unspecified site: Secondary | ICD-10-CM | POA: Diagnosis not present

## 2021-01-12 DIAGNOSIS — E785 Hyperlipidemia, unspecified: Secondary | ICD-10-CM | POA: Diagnosis not present

## 2021-01-12 DIAGNOSIS — F3341 Major depressive disorder, recurrent, in partial remission: Secondary | ICD-10-CM | POA: Diagnosis not present

## 2021-01-12 DIAGNOSIS — I10 Essential (primary) hypertension: Secondary | ICD-10-CM | POA: Diagnosis not present

## 2021-01-12 DIAGNOSIS — J302 Other seasonal allergic rhinitis: Secondary | ICD-10-CM | POA: Diagnosis not present

## 2021-01-18 DIAGNOSIS — Z961 Presence of intraocular lens: Secondary | ICD-10-CM | POA: Diagnosis not present

## 2021-02-18 ENCOUNTER — Other Ambulatory Visit: Payer: Self-pay

## 2021-02-18 ENCOUNTER — Ambulatory Visit: Payer: Medicare HMO | Admitting: Orthopedic Surgery

## 2021-02-18 ENCOUNTER — Ambulatory Visit (INDEPENDENT_AMBULATORY_CARE_PROVIDER_SITE_OTHER): Payer: Medicare HMO

## 2021-02-18 ENCOUNTER — Encounter: Payer: Self-pay | Admitting: Orthopedic Surgery

## 2021-02-18 VITALS — BP 117/74 | HR 99

## 2021-02-18 DIAGNOSIS — S52124A Nondisplaced fracture of head of right radius, initial encounter for closed fracture: Secondary | ICD-10-CM

## 2021-02-18 NOTE — Progress Notes (Signed)
Office Visit Note   Patient: Peter Best           Date of Birth: 09-11-1940           MRN: [id number] Visit Date: 02/18/2021              Requested by: [name], MD [address] [place],  [place] [address] PCP: [name], MD   Assessment & Plan: Visit Diagnoses:  1. Closed nondisplaced fracture of head of right radius, initial encounter     Plan: Patient is overall improved from when he was first seen over a month ago.  He still has sore aching pain centered around the elbow over the [facility] fossa.  This radiates proximally and distally.  Reviewed his x-rays that were taken today which demonstrate concentric and symmetric reduction of the ulnohumeral and radiocapitellar joints.  There are some small bony fragments in the joint.  We looked at his elbow under fluoroscopy which showed that one of those small fragments is from the radial head.  There is no evidence of instability with live fluoroscopy of the elbow.  There is full ROM without clicking, locking, or catching with pronation/supination. We will continue nonoperative management of the small fractures.  He can take ibuprofen as needed for pain.  He continue to use the elbow to prevent stiffness.  I can see him back as needed.  Follow-Up Instructions: No follow-ups on file.   Orders:  Orders Placed This Encounter  Procedures   XR Elbow 2 Views Right   No orders of the defined types were placed in this encounter.     Procedures: No procedures performed   Clinical Data: No additional findings.   Subjective: Chief Complaint  Patient presents with   Right Elbow - Follow-up    This is an 80 year old right-hand-dominant male who presents with continued right elbow pain.  He was seen over a month ago following a ground-level fall.  He was found to have a small fracture of his radial head and possible fracture from olecranon enthesophyte.  He has no elbow instability and has near full range of  motion at that time.  We discussed operative versus nonoperative management with the patient electing to proceed with nonoperative management and early range of motion.  Today he describes he is overall improved from when he was first seen but does note some aching soreness in the elbow mostly around the [facility] joint that radiates both proximally distally.  His pain is worse with prolonged activity or specific motions.  He has no evidence of instability.  He has no clicking, locking, or catching.   Review of Systems   Objective: Vital Signs: BP 117/74 (BP Location: Left Arm, Patient Position: Sitting, Cuff Size: Normal)   Pulse 99   SpO2 96%   Physical Exam Constitutional:      Appearance: Normal appearance.  Cardiovascular:     Rate and Rhythm: Normal rate.     Pulses: Normal pulses.  Pulmonary:     Effort: Pulmonary effort is normal.  Skin:    General: Skin is warm and dry.     Capillary Refill: Capillary refill takes less than 2 seconds.  Neurological:     Mental Status: He is alert.    Left Elbow Exam   Tenderness  The patient is experiencing no tenderness.   Muscle Strength  The patient has normal left elbow strength.  Tests  Varus: negative Valgus: negative  Other  Erythema: absent Sensation:  normal Pulse: present  Comments:  Full flexion with mild discomfort at terminal flexion.  Lacks approximately 10 degrees of extension secondary to pain.  Full and painless pronation/supination.  No varus/valgus instability.      Specialty Comments:  No specialty comments available.  Imaging: AP and attempted lateral views of the right elbow taken today reviewed interpreted by me.  They demonstrate concentric reduction of the ulnohumeral and radiocapitellar joints.  There is a small bony fragment in the anterior aspect of the joint and posteriorly.  The radial head appears to be intact and concentric on these views.  This right elbow was examined under live fluoroscopy.   While this fragments anteriorly seems to come from the radial head.  The radial head remained stable and concentric with the capitellum.  There is no clunking, clicking, or locking with pronation supination of the elbow.  There is no evidence of elbow instability.   PMFS History: Patient Active Problem List   Diagnosis Date Noted   Fracture of radial head, right, closed 01/11/2021   Right bundle branch block 10/01/2018   Essential hypertension 10/01/2018   Pure hypertriglyceridemia 10/01/2018   Past Medical History:  Diagnosis Date   Allergy    Anxiety disorder    BBB (bundle branch block)    Benign prostatic hyperplasia    Cancer (HCC)    skin - on nose and temple   Cataract    just watching - bilateral   Gastroesophageal reflux disease    HA (headache)    otc med prn   Hyperlipidemia    Hypertensive disorder    Inguinal hernia    repaired by surgery   Insomnia    Kidney stones    passed stone - no surgery required   Major depressive disorder    Osteoarthritis    OF THE HIP and mid back    Family History  Problem Relation Age of Onset   Colon polyps Neg Hx    Rectal cancer Neg Hx    Stomach cancer Neg Hx     Past Surgical History:  Procedure Laterality Date   COLONOSCOPY W/ POLYPECTOMY  09/2014   polyps  - [place] in [place]   COLONOSCOPY W/ POLYPECTOMY     UNCODED SURGICAL HISTORY   HERNIA REPAIR     INGUINAL HERNIA REPAIR     MOHS SURGERY  2018   x x  on nose   MOHS SURGERY     MOHS' CHEMOSURGERY PROCEDURE DATE: 09/30/2010:BCC R INFERIOR TEMPLE, UNCODED SURGICAL HISTORY   TONSILLECTOMY     WISDOM TOOTH EXTRACTION     Social History   Occupational History   Not on file  Tobacco Use   Smoking status: Never    Passive exposure: Never   Smokeless tobacco: Never  Vaping Use   Vaping Use: Never used  Substance and Sexual Activity   Alcohol use: Never   Drug use: No   Sexual activity: Not on file

## 2021-02-25 DIAGNOSIS — E7800 Pure hypercholesterolemia, unspecified: Secondary | ICD-10-CM | POA: Diagnosis not present

## 2021-02-25 DIAGNOSIS — D692 Other nonthrombocytopenic purpura: Secondary | ICD-10-CM | POA: Diagnosis not present

## 2021-02-25 DIAGNOSIS — G479 Sleep disorder, unspecified: Secondary | ICD-10-CM | POA: Diagnosis not present

## 2021-02-25 DIAGNOSIS — I4510 Unspecified right bundle-branch block: Secondary | ICD-10-CM | POA: Diagnosis not present

## 2021-02-25 DIAGNOSIS — Z0000 Encounter for general adult medical examination without abnormal findings: Secondary | ICD-10-CM | POA: Diagnosis not present

## 2021-02-25 DIAGNOSIS — I10 Essential (primary) hypertension: Secondary | ICD-10-CM | POA: Diagnosis not present

## 2021-02-25 DIAGNOSIS — K2100 Gastro-esophageal reflux disease with esophagitis, without bleeding: Secondary | ICD-10-CM | POA: Diagnosis not present

## 2021-02-25 DIAGNOSIS — R69 Illness, unspecified: Secondary | ICD-10-CM | POA: Diagnosis not present

## 2021-04-12 DIAGNOSIS — R748 Abnormal levels of other serum enzymes: Secondary | ICD-10-CM | POA: Diagnosis not present

## 2021-05-04 ENCOUNTER — Encounter: Payer: Self-pay | Admitting: Cardiology

## 2021-05-04 ENCOUNTER — Other Ambulatory Visit: Payer: Self-pay

## 2021-05-04 ENCOUNTER — Ambulatory Visit: Payer: Medicare HMO | Admitting: Cardiology

## 2021-05-04 DIAGNOSIS — E7800 Pure hypercholesterolemia, unspecified: Secondary | ICD-10-CM | POA: Diagnosis not present

## 2021-05-04 DIAGNOSIS — Z8679 Personal history of other diseases of the circulatory system: Secondary | ICD-10-CM | POA: Diagnosis not present

## 2021-05-04 DIAGNOSIS — I4510 Unspecified right bundle-branch block: Secondary | ICD-10-CM | POA: Diagnosis not present

## 2021-05-04 DIAGNOSIS — I10 Essential (primary) hypertension: Secondary | ICD-10-CM | POA: Diagnosis not present

## 2021-05-04 HISTORY — DX: Personal history of other diseases of the circulatory system: Z86.79

## 2021-05-04 NOTE — Assessment & Plan Note (Signed)
On lisinopril. Doing well. Dr.Ehinger has been monitoring.  Overall well controlled.

## 2021-05-04 NOTE — Assessment & Plan Note (Signed)
Atorvastatin, no myalgias.  Doing well.  Prevention. prior  LDL 106

## 2021-05-04 NOTE — Patient Instructions (Signed)
Medication Instructions:  The current medical regimen is effective;  continue present plan and medications.  *If you need a refill on your cardiac medications before your next appointment, please call your pharmacy*  Follow-Up: At [facility], you and your health needs are our priority.  As part of our continuing mission to provide you with exceptional heart care, we have created designated Provider Care Teams.  These Care Teams include your primary Cardiologist (physician) and Advanced Practice Providers (APPs -  Physician Assistants and Nurse Practitioners) who all work together to provide you with the care you need, when you need it.  We recommend signing up for the patient portal called "MyChart".  Sign up information is provided on this After Visit Summary.  MyChart is used to connect with patients for Virtual Visits (Telemedicine).  Patients are able to view lab/test results, encounter notes, upcoming appointments, etc.  Non-urgent messages can be sent to your provider as well.   To learn more about what you can do with MyChart, go to [email or web address].    Your next appointment:   1 year(s)  The format for your next appointment:   In Person  Provider:   Mark Skains, MD   Thank you for choosing [facility]!!    

## 2021-05-04 NOTE — Assessment & Plan Note (Signed)
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  Likely source of soft murmur.

## 2021-05-04 NOTE — Progress Notes (Signed)
Cardiology Office Note:    Date:  05/04/2021   ID:  Peter Best, DOB November 27, 1940, MRN [id number]  PCP:  [name], MD  [facility] Cardiologist:  Donato Schultz, MD  [facility] Electrophysiologist:  None   Referring MD: [name], MD   History of Present Illness:    Peter Best is a 81 y.o. male here for the follow-up of hypertension hyperlipidemia right bundle branch block.  Had rheumatic fever as a child.  Worked in radio and television previously.  Has been on statin and ACE inhibitor for quite some time.  Doing well.  Also on Zoloft in the past for anxiety.  In the past, when checking blood pressure, device had measured irregular heartbeat at 1 point.  Been doing very well lives at [place].  Uses the exercise machine there continues to walk.    Today, he is doing well. He recently underwent a peripheral arterial disease screening.  This showed an abnormal result on both left and right side of lower extremities.  Minimal plaque was also noted in the carotid arteries.  He denies tingling or loss of sensation in his LE. He endorses occasional pain in his posterior R knee. However, he believes this pain is related to arthritis. In the past, he has twisted his bilateral ankles. He wondered if the previous ankle injuries could restrict blood flow.  His hip pain occasionally bothers him. However, he attends physical therapy for the pain.  He tires quickly after walking up inclines. However, he relates this to old age.   He denies any palpitations, chest pain, or shortness of breath, lightheadedness, headaches, syncope, orthopnea, PND, or lower extremity edema.  Past Medical History:  Diagnosis Date   Allergy    Anxiety disorder    BBB (bundle branch block)    Benign prostatic hyperplasia    Cancer (HCC)    skin - on nose and temple   Cataract    just watching - bilateral   Gastroesophageal reflux disease    HA (headache)    otc med prn    Hyperlipidemia    Hypertensive disorder    Inguinal hernia    repaired by surgery   Insomnia    Kidney stones    passed stone - no surgery required   Major depressive disorder    Osteoarthritis    OF THE HIP and mid back    Past Surgical History:  Procedure Laterality Date   COLONOSCOPY W/ POLYPECTOMY  09/2014   polyps  - done in [place]   COLONOSCOPY W/ POLYPECTOMY     UNCODED SURGICAL HISTORY   HERNIA REPAIR     INGUINAL HERNIA REPAIR     MOHS SURGERY  2018   x x  on nose   MOHS SURGERY     MOHS' CHEMOSURGERY PROCEDURE DATE: 09/30/2010:BCC R INFERIOR TEMPLE, UNCODED SURGICAL HISTORY   TONSILLECTOMY     WISDOM TOOTH EXTRACTION      Current Medications: Current Meds  Medication Sig   acetaminophen (TYLENOL) 500 MG tablet Take 1,000 mg by mouth every 6 (six) hours as needed for mild pain.   atorvastatin (LIPITOR) 20 MG tablet Take 20 mg by mouth daily.   famotidine (PEPCID) 20 MG tablet Take 20 mg by mouth in the morning and at bedtime.   HYDROcodone-acetaminophen (NORCO/VICODIN) 5-325 MG tablet Take 0.5-1 tablets by mouth every 6 (six) hours as needed for severe pain.   ibuprofen (ADVIL,MOTRIN) 400 MG tablet Take 400 mg by mouth every 6 (  six) hours as needed for mild pain.   lisinopril (ZESTRIL) 20 MG tablet Take 20 mg by mouth daily.   omeprazole (PRILOSEC) 20 MG capsule Take 20 mg by mouth daily.   sertraline (ZOLOFT) 100 MG tablet Take 1 tablet by mouth as needed.   traZODone (DESYREL) 50 MG tablet Take 1 tablet by mouth as needed.   Current Facility-Administered Medications for the 05/04/21 encounter (Office Visit) with Jake Bathe, MD  Medication   0.9 %  sodium chloride infusion     Allergies:   Patient has no known allergies.   Social History   Socioeconomic History   Marital status: Single    Spouse name: Not on file   Number of children: Not on file   Years of education: Not on file   Highest education level: Not on file  Occupational History   Not on  file  Tobacco Use   Smoking status: Never    Passive exposure: Never   Smokeless tobacco: Never  Vaping Use   Vaping Use: Never used  Substance and Sexual Activity   Alcohol use: Never   Drug use: No   Sexual activity: Not on file  Other Topics Concern   Not on file  Social History Narrative   Not on file   Social Determinants of Health   Financial Resource Strain: Not on file  Food Insecurity: Not on file  Transportation Needs: Not on file  Physical Activity: Not on file  Stress: Not on file  Social Connections: Not on file     Family History: The patient's family history is negative for Colon polyps, Rectal cancer, and Stomach cancer.  ROS:   Please see the history of present illness.    (+) Hip pain (+) Posterior R knee pain  All other systems reviewed and are negative.  EKGs/Labs/Other Studies Reviewed:    The following studies were reviewed today: Echo 07/25/16 - Left ventricle: The cavity size was normal. There was mild    concentric hypertrophy. Systolic function was normal. The    estimated ejection fraction was in the range of 55% to 60%. Wall    motion was normal; there were no regional wall motion    abnormalities. Doppler parameters are consistent with abnormal    left ventricular relaxation (grade 1 diastolic dysfunction).    There was no evidence of elevated ventricular filling pressure by    Doppler parameters.  - Aortic valve: Trileaflet; moderately thickened, moderately    calcified leaflets. There was mild regurgitation.  - Aortic root: The aortic root was normal in size.  - Mitral valve: There was no regurgitation.  - Tricuspid valve: There was no regurgitation.  - Pulmonic valve: There was mild regurgitation.  - Pulmonary arteries: Systolic pressure was within the normal    range.  - Inferior vena cava: The vessel was normal in size.  - Pericardium, extracardiac: There was no pericardial effusion.   EKG:  EKG was not ordered today 10/07/19:  sinus rhythm right bundle branch block 76 bpm  Recent Labs: 12/05/2020: ALT 13; BUN 17; Creatinine, Ser 0.78; Hemoglobin 15.4; Platelets 174; Potassium 3.8; Sodium 139  Recent Lipid Panel No results found for: CHOL, TRIG, HDL, CHOLHDL, VLDL, LDLCALC, LDLDIRECT  Physical Exam:    VS:  BP 130/68 (BP Location: Left Arm, Patient Position: Sitting, Cuff Size: Normal)    Pulse 88    Ht 5\' 9"  (1.753 m)    Wt 169 lb (76.7 kg)    SpO2 95%  BMI 24.96 kg/m     Wt Readings from Last 3 Encounters:  05/04/21 169 lb (76.7 kg)  01/11/21 173 lb 12.8 oz (78.8 kg)  01/05/21 170 lb (77.1 kg)     GEN:  Well nourished, well developed in no acute distress HEENT: Normal NECK: No JVD; No carotid bruits LYMPHATICS: No lymphadenopathy CARDIAC: RRR, no murmurs, rubs, gallops RESPIRATORY:  Clear to auscultation without rales, wheezing or rhonchi  ABDOMEN: Soft, non-tender, non-distended MUSCULOSKELETAL:  No edema; No deformity  SKIN: Warm and dry NEUROLOGIC:  Alert and oriented x 3 PSYCHIATRIC:  Normal affect   ASSESSMENT:    1. Right bundle branch block   2. Essential hypertension   3. Pure hypercholesterolemia   4. H/O rheumatic heart disease     PLAN:    Right bundle branch block No high risk symptoms such as syncope.  Longstanding.  Stable.  Normal ejection fraction.  Doing well.  Explained the physiology behind this.  He checks his blood pressure, heart rate periodically.  Essential hypertension On lisinopril.  Doing well.  Dr.Ehinger has been monitoring.  Overall well controlled.  Pure hypercholesterolemia Atorvastatin, no myalgias.  Doing well.  Prevention. 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  Likely source of soft murmur.  Prior [facility] screening abnormality in lower extremities.  I was able to effectively feel his pulses bilaterally.  He is not having any claudication type symptoms.  Carotids also sound normal, no  bruits.  Continue with atorvastatin.  I do not feel strongly that we need to pursue any further testing at this time.  If symptoms do develop, he will let us know.   Medication Adjustments/Labs and Tests Ordered: Current medicines are reviewed at length with the patient today.  Concerns regarding medicines are outlined above.  No orders of the defined types were placed in this encounter.  No orders of the defined types were placed in this encounter.   Patient Instructions  Medication Instructions:  The current medical regimen is effective;  continue present plan and medications.  *If you need a refill on your cardiac medications before your next appointment, please call your pharmacy*  Follow-Up: At [facility], you and your health needs are our priority.  As part of our continuing mission to provide you with exceptional heart care, we have created designated Provider Care Teams.  These Care Teams include your primary Cardiologist (physician) and Advanced Practice Providers (APPs -  Physician Assistants and Nurse Practitioners) who all work together to provide you with the care you need, when you need it.  We recommend signing up for the patient portal called "MyChart".  Sign up information is provided on this After Visit Summary.  MyChart is used to connect with patients for Virtual Visits (Telemedicine).  Patients are able to view lab/test results, encounter notes, upcoming appointments, etc.  Non-urgent messages can be sent to your provider as well.   To learn more about what you can do with MyChart, go to [email or web address].    Your next appointment:   1 year(s)  The format for your next appointment:   In Person  Provider:   Donato Schultz, MD {  Thank you for choosing [facility]!!      Carilyn Goodpasture Javier,acting as a scribe for Donato Schultz, MD.,have documented all relevant documentation on the behalf of Donato Schultz, MD,as directed by  Donato Schultz, MD while in  the presence of Donato Schultz, MD.  I,  Donato Schultz, MD, have reviewed all documentation for this visit. The documentation on 05/04/21 for the exam, diagnosis, procedures, and orders are all accurate and complete.   Signed, Donato Schultz, MD  05/04/2021 10:58 AM    [facility]

## 2021-05-04 NOTE — Assessment & Plan Note (Signed)
No high risk symptoms such as syncope. Longstanding. Stable.  Normal ejection fraction.  Doing well.  Explained the physiology behind this.  He checks his blood pressure, heart rate periodically.

## 2021-05-10 DIAGNOSIS — R5383 Other fatigue: Secondary | ICD-10-CM | POA: Diagnosis not present

## 2021-05-10 DIAGNOSIS — R413 Other amnesia: Secondary | ICD-10-CM | POA: Diagnosis not present

## 2021-05-10 DIAGNOSIS — I950 Idiopathic hypotension: Secondary | ICD-10-CM | POA: Diagnosis not present

## 2021-05-10 DIAGNOSIS — R195 Other fecal abnormalities: Secondary | ICD-10-CM | POA: Diagnosis not present

## 2021-05-14 ENCOUNTER — Ambulatory Visit: Payer: Medicare HMO | Admitting: Physician Assistant

## 2021-05-18 DIAGNOSIS — Z08 Encounter for follow-up examination after completed treatment for malignant neoplasm: Secondary | ICD-10-CM | POA: Diagnosis not present

## 2021-05-18 DIAGNOSIS — L814 Other melanin hyperpigmentation: Secondary | ICD-10-CM | POA: Diagnosis not present

## 2021-05-18 DIAGNOSIS — L821 Other seborrheic keratosis: Secondary | ICD-10-CM | POA: Diagnosis not present

## 2021-05-18 DIAGNOSIS — L570 Actinic keratosis: Secondary | ICD-10-CM | POA: Diagnosis not present

## 2021-05-18 DIAGNOSIS — L538 Other specified erythematous conditions: Secondary | ICD-10-CM | POA: Diagnosis not present

## 2021-05-18 DIAGNOSIS — Z85828 Personal history of other malignant neoplasm of skin: Secondary | ICD-10-CM | POA: Diagnosis not present

## 2021-05-18 DIAGNOSIS — D225 Melanocytic nevi of trunk: Secondary | ICD-10-CM | POA: Diagnosis not present

## 2021-05-18 DIAGNOSIS — L820 Inflamed seborrheic keratosis: Secondary | ICD-10-CM | POA: Diagnosis not present

## 2021-05-21 ENCOUNTER — Other Ambulatory Visit (INDEPENDENT_AMBULATORY_CARE_PROVIDER_SITE_OTHER): Payer: Medicare HMO

## 2021-05-21 ENCOUNTER — Ambulatory Visit: Payer: Medicare HMO | Admitting: Physician Assistant

## 2021-05-21 ENCOUNTER — Other Ambulatory Visit: Payer: Self-pay

## 2021-05-21 ENCOUNTER — Encounter: Payer: Self-pay | Admitting: Physician Assistant

## 2021-05-21 VITALS — BP 140/74 | HR 90 | Ht 71.0 in | Wt 168.8 lb

## 2021-05-21 DIAGNOSIS — F0280 Dementia in other diseases classified elsewhere without behavioral disturbance: Secondary | ICD-10-CM

## 2021-05-21 DIAGNOSIS — R413 Other amnesia: Secondary | ICD-10-CM | POA: Diagnosis not present

## 2021-05-21 DIAGNOSIS — G309 Alzheimer's disease, unspecified: Secondary | ICD-10-CM | POA: Diagnosis not present

## 2021-05-21 DIAGNOSIS — G301 Alzheimer's disease with late onset: Secondary | ICD-10-CM | POA: Diagnosis not present

## 2021-05-21 DIAGNOSIS — R69 Illness, unspecified: Secondary | ICD-10-CM | POA: Diagnosis not present

## 2021-05-21 DIAGNOSIS — F02A0 Dementia in other diseases classified elsewhere, mild, without behavioral disturbance, psychotic disturbance, mood disturbance, and anxiety: Secondary | ICD-10-CM

## 2021-05-21 LAB — VITAMIN B12: Vitamin B-12: 329 pg/mL (ref 211–911)

## 2021-05-21 LAB — TSH: TSH: 0.9 u[IU]/mL (ref 0.35–5.50)

## 2021-05-21 NOTE — Progress Notes (Signed)
? ? ?Assessment/Plan:  ? ?Peter Best is a very pleasant 81 y.o. year old RH male with  a history of hypertension, hyperlipidemia, insomnia, osteoarthritis, right bundle branch block, anxiety, depression, chronic GI issues including diarrhea, seen today for evaluation of memory loss. MoCA today is 20/30, with delayed recall 1/5, despite good, 15 words in 1 minute.  He also had deficiencies in attention, and visual-spatial executive 1/5.  Findings are suspicious for mild dementia, likely due to Alzheimer's disease with a certain degree of vascular component.   ? ? Recommendations:  ? 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 ?MRI brain with/without contrast to assess for underlying structural abnormality and assess vascular load  ?Neurocognitive testing to further evaluate cognitive concerns and determine other underlying cause of memory changes, including potential contribution from sleep, anxiety, or depression  ?Check B12, TSH ?Will consider memantine pending on the MRI results ?Discussed safety both in and out of the home.  ?Discussed the importance of regular daily schedule with inclusion of crossword puzzles to maintain brain function.  ?Continue to monitor mood with PCP.  ?Stay active at least 30 minutes at least 3 times a week.  ?Naps should be scheduled and should be no longer than 60 minutes and should not occur after 2 PM.  ?Control cardiovascular risk factors  ?Recommend GI evaluation for chronic diarrhea ?Mediterranean diet is recommended  ?Folllow up in 3 months ? ?Subjective:  ? ? ?The patient is seen in neurologic consultation at the request of [name], [name], MD for the evaluation of memory.  The patient is here alone.   ?This is a 81 y.o. year old RH  male who has had memory issues for about 2 years, worse over the last 4 or 5 months.  He states that his main complaint is recalling a word, "I cannot pull it out of the bag right away, and the neighbor told  me that he may be a normal aging or not ".  He states that he is more conscious about his memory issues since he turned 81 years old.  He denies repeating himself or being disoriented when walking into her room or leaving objects in unusual places.  He ambulates without difficulty around the community, continues to walk and uses an exercise machine.  He lives in [place] and he is originally from [place], he moved here upon retirement for more tranquil life.  He lives alone but he has a girlfriend.  He denies any falls other than one in October 2022 when he fractured his right elbow which is "still healing ". Last head injury was about 40 years ago in [place] when he fell on ice and hit the back of his head on asphalt without LOC.  His mood is overall good, without irritability.  He has a history of depression, well controlled with sertraline.  He likes to do word finding, he does not enjoy much board games or other activities.  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  He has intermittent years of insomnia, denies vivid dreams or sleepwalking, hallucinations or paranoia.  He does not feel rested, he reports that his level of energy is decreased, at times he feels sleepy.  His appetite is fair,  denies trouble swallowing, he does not cook.  He denies any recent headaches, but he has a history of migraines, periodically he has an event, without aura, and is treated with Tylenol.  He denies double vision, dizziness, focal numbness or tingling, unilateral weakness, tremors or anosmia.  No history of seizures.  He denies urine incontinence, but he does not like to drink enough water, because he does not to urinate that frequently.  He denies constipation.  He has on and off issues with  diarrhea.  He denies sleep apnea, alcohol, or tobacco history.  Family history for dementia in his father (unknown type) ?  ?Labs December 2022 shows triglycerides 231, total cholesterol 172, LDL 97, ? ?No Known Allergies ? ?Current Outpatient Medications  ?Medication Instructions  ? acetaminophen (TYLENOL) 1,000 mg, Oral, Every 6 hours PRN  ? atorvastatin (LIPITOR) 20 mg, Oral, Daily  ? famotidine (PEPCID) 20 mg, Oral, 2 times daily  ? ibuprofen (ADVIL) 400 mg, Oral, Every 6 hours PRN  ? lisinopril (ZESTRIL) 20 mg, Oral, Daily  ? omeprazole (PRILOSEC) 20 mg, Oral, Daily  ? sertraline (ZOLOFT) 100 MG tablet 1 tablet, Oral, As needed  ? traZODone (DESYREL) 50 MG tablet 1 tablet, Oral, As needed  ? ? ? ?VITALS:   ?Vitals:  ? 05/21/21 1323  ?BP: 140/74  ?Pulse: 90  ?SpO2: 98%  ?Weight: 168 lb 12.8 oz (76.6 kg)  ?Height: '5\' 11"'$  (1.803 m)  ? ?No flowsheet data found. ? ?PHYSICAL EXAM  ? ?HEENT:  Normocephalic, atraumatic. The mucous membranes are moist. The superficial temporal arteries are without ropiness or tenderness. ?Cardiovascular: Regular rate and rhythm. ?Lungs: Clear to auscultation bilaterally. ?Neck: There are no carotid bruits noted bilaterally. ? ?NEUROLOGICAL: ?Montreal Cognitive Assessment  05/21/2021  ?Visuospatial/ Executive (0/5) 1  ?Naming (0/3) 3  ?Attention: Read list of digits (0/2) 2  ?Attention: Read list of letters (0/1) 1  ?Attention: Serial 7 subtraction starting at 100 (0/3) 2  ?Language: Repeat phrase (0/2) 2  ?Language : Fluency (0/1) 1  ?Abstraction (0/2) 1  ?Delayed Recall (0/5) 1  ?Orientation (0/6) 6  ?Total 20  ? ?No flowsheet data found.  ?No flowsheet data found.  ? ?Orientation:  Alert and oriented to person, place and time. No aphasia or dysarthria. Fund of knowledge is appropriate. Recent memory impaired and remote memory intact.  Attention and concentration except for substraction.  Able to name objects and repeat phrases. Delayed recall 1/5  ?Cranial nerves: There is good facial  symmetry. Extraocular muscles are intact and visual fields are full to confrontational testing. Speech is fluent and clear. Soft palate rises symmetrically and there is no tongue deviation. Hearing is intact to conversational tone. ?Tone: Tone is good throughout. ?Sensation: Sensation is intact to light touch and pinprick throughout. Vibration is intact at the bilateral big toe.There is no extinction with double simultaneous stimulation. There is no sensory dermatomal level identified. ?Coordination: The patient has no difficulty with RAM's or FNF bilaterally. Normal finger to nose  ?Motor: Strength is 5/5 in the bilateral upper and lower extremities. There is no pronator drift. There are no fasciculations noted. ?DTR's: Deep tendon reflexes are 2/4 at the bilateral biceps, triceps, brachioradialis, patella and achilles.  Plantar responses are downgoing bilaterally. ?Gait and Station: The patient is able to ambulate without difficulty.The patient is able to heel toe walk without any difficulty.The patient is able to ambulate in a tandem fashion. The patient is able to stand in the Romberg position. ?  ? ? ?  Thank you for allowing [place] the opportunity to participate in the care of this nice patient. Please do not hesitate to contact us for any questions or concerns.  ? ?Total time spent on today's visit was 60 minutes, including both face-to-face time and nonface-to-face time.  Time included that spent on review of records (prior notes available to me/labs/imaging if pertinent), discussing treatment and goals, answering patient's questions and coordinating care. ? ?Cc:  Gaynelle Arabian, MD ? ?[name] ?05/21/2021 2:08 PM   ?

## 2021-05-21 NOTE — Patient Instructions (Addendum)
It was a pleasure to see you today at our office.   Recommendations:  Neurocognitive evaluation at our office MRI of the brain, the radiology office will call you to arrange you appointment Check labs today B12 and thyroid  Follow up in 3 months  We will entertain a medicine for the memory pending on the results of the MRI    RECOMMENDATIONS FOR ALL PATIENTS WITH MEMORY PROBLEMS: 1. Continue to exercise (Recommend 30 minutes of walking everyday, or 3 hours every week) 2. Increase social interactions - continue going to [place] and enjoy social gatherings with friends and family 3. Eat healthy, avoid fried foods and eat more fruits and vegetables 4. Maintain adequate blood pressure, blood sugar, and blood cholesterol level. Reducing the risk of stroke and cardiovascular disease also helps promoting better memory. 5. Avoid stressful situations. Live a simple life and avoid aggravations. Organize your time and prepare for the next day in anticipation. 6. Sleep well, avoid any interruptions of sleep and avoid any distractions in the bedroom that may interfere with adequate sleep quality 7. Avoid sugar, avoid sweets as there is a strong link between excessive sugar intake, diabetes, and cognitive impairment We discussed the Mediterranean diet, which has been shown to help patients reduce the risk of progressive memory disorders and reduces cardiovascular risk. This includes eating fish, eat fruits and green leafy vegetables, nuts like almonds and hazelnuts, walnuts, and also use olive oil. Avoid fast foods and fried foods as much as possible. Avoid sweets and sugar as sugar use has been linked to worsening of memory function.  There is always a concern of gradual progression of memory problems. If this is the case, then we may need to adjust level of care according to patient needs. Support, both to the patient and caregiver, should then be put into place.      You have been referred for a  neuropsychological evaluation (i.e., evaluation of memory and thinking abilities). Please bring someone with you to this appointment if possible, as it is helpful for the doctor to hear from both you and another adult who knows you well. Please bring eyeglasses and hearing aids if you wear them.    The evaluation will take approximately 3 hours and has two parts:   The first part is a clinical interview with the neuropsychologist (Dr. Melvyn Novas or Dr. Nicole Kindred). During the interview, the neuropsychologist will speak with you and the individual you brought to the appointment.    The second part of the evaluation is testing with the doctor's technician Hinton Dyer or Maudie Mercury). During the testing, the technician will ask you to remember different types of material, solve problems, and answer some questionnaires. Your family member will not be present for this portion of the evaluation.   Please note: We must reserve several hours of the neuropsychologist's time and the psychometrician's time for your evaluation appointment. As such, there is a No-Show fee of $100. If you are unable to attend any of your appointments, please contact our office as soon as possible to reschedule.    FALL PRECAUTIONS: Be cautious when walking. Scan the area for obstacles that may increase the risk of trips and falls. When getting up in the mornings, sit up at the edge of the bed for a few minutes before getting out of bed. Consider elevating the bed at the head end to avoid drop of blood pressure when getting up. Walk always in a well-lit room (use night lights in the walls). Avoid  area rugs or power cords from appliances in the middle of the walkways. Use a walker or a cane if necessary and consider physical therapy for balance exercise. Get your eyesight checked regularly.  FINANCIAL OVERSIGHT: Supervision, especially oversight when making financial decisions or transactions is also recommended.  HOME SAFETY: Consider the safety of the  kitchen when operating appliances like stoves, microwave oven, and blender. Consider having supervision and share cooking responsibilities until no longer able to participate in those. Accidents with firearms and other hazards in the house should be identified and addressed as well.   ABILITY TO BE LEFT ALONE: If patient is unable to contact 911 operator, consider using LifeLine, or when the need is there, arrange for someone to stay with patients. Smoking is a fire hazard, consider supervision or cessation. Risk of wandering should be assessed by caregiver and if detected at any point, supervision and safe proof recommendations should be instituted.  MEDICATION SUPERVISION: Inability to self-administer medication needs to be constantly addressed. Implement a mechanism to ensure safe administration of the medications.   DRIVING: Regarding driving, in patients with progressive memory problems, driving will be impaired. We advise to have someone else do the driving if trouble finding directions or if minor accidents are reported. Independent driving assessment is available to determine safety of driving.   If you are interested in the driving assessment, you can contact the following:  [employer] in [place]  [place] [phone] or [phone]    [place] refers to food and lifestyle choices that are based on the traditions of countries located on the [employer]. This way of eating has been shown to help prevent certain conditions and improve outcomes for people who have chronic diseases, like kidney disease and heart disease. What are tips for following this plan? Lifestyle  Cook and eat meals together with your family, when possible. Drink enough fluid to keep your urine clear or pale yellow. Be physically active every day. This  includes: Aerobic exercise like running or swimming. Leisure activities like gardening, walking, or housework. Get 7-8 hours of sleep each night. If recommended by your health care provider, drink red wine in moderation. This means 1 glass a day for nonpregnant women and 2 glasses a day for men. A glass of wine equals 5 oz (150 mL). Reading food labels  Check the serving size of packaged foods. For foods such as rice and pasta, the serving size refers to the amount of cooked product, not dry. Check the total fat in packaged foods. Avoid foods that have saturated fat or trans fats. Check the ingredients list for added sugars, such as corn syrup. Shopping  At the grocery store, buy most of your food from the areas near the walls of the store. This includes: Fresh fruits and vegetables (produce). Grains, beans, nuts, and seeds. Some of these may be available in unpackaged forms or large amounts (in bulk). Fresh seafood. Poultry and eggs. Low-fat dairy products. Buy whole ingredients instead of prepackaged foods. Buy fresh fruits and vegetables in-season from local farmers markets. Buy frozen fruits and vegetables in resealable bags. If you do not have access to quality fresh seafood, buy precooked frozen shrimp or canned fish, such as tuna, salmon, or sardines. Buy small amounts of raw or cooked vegetables, salads, or olives from the deli or salad bar at your store. Stock your pantry so you always have  certain foods on hand, such as olive oil, canned tuna, canned tomatoes, rice, pasta, and beans. Cooking  Cook foods with extra-virgin olive oil instead of using butter or other vegetable oils. Have meat as a side dish, and have vegetables or grains as your main dish. This means having meat in small portions or adding small amounts of meat to foods like pasta or stew. Use beans or vegetables instead of meat in common dishes like chili or lasagna. Experiment with different cooking methods. Try  roasting or broiling vegetables instead of steaming or sauteing them. Add frozen vegetables to soups, stews, pasta, or rice. Add nuts or seeds for added healthy fat at each meal. You can add these to yogurt, salads, or vegetable dishes. Marinate fish or vegetables using olive oil, lemon juice, garlic, and fresh herbs. Meal planning  Plan to eat 1 vegetarian meal one day each week. Try to work up to 2 vegetarian meals, if possible. Eat seafood 2 or more times a week. Have healthy snacks readily available, such as: Vegetable sticks with hummus. Greek yogurt. Fruit and nut trail mix. Eat balanced meals throughout the week. This includes: Fruit: 2-3 servings a day Vegetables: 4-5 servings a day Low-fat dairy: 2 servings a day Fish, poultry, or lean meat: 1 serving a day Beans and legumes: 2 or more servings a week Nuts and seeds: 1-2 servings a day Whole grains: 6-8 servings a day Extra-virgin olive oil: 3-4 servings a day Limit red meat and sweets to only a few servings a month What are my food choices? Mediterranean diet Recommended Grains: Whole-grain pasta. Brown rice. Bulgar wheat. Polenta. Couscous. Whole-wheat bread. [place]. Vegetables: Artichokes. Beets. Broccoli. Cabbage. Carrots. Eggplant. Green beans. Chard. Kale. Spinach. Onions. Leeks. Peas. Squash. Tomatoes. Peppers. Radishes. Fruits: Apples. Apricots. Avocado. Berries. Bananas. Cherries. Dates. Figs. Grapes. Lemons. Melon. Oranges. Peaches. Plums. Pomegranate. Meats and other protein foods: Beans. Almonds. Sunflower seeds. Pine nuts. Peanuts. [place]. Salmon. Scallops. Shrimp. [place]. Tilapia. Clams. Oysters. Eggs. Dairy: Low-fat milk. Cheese. Greek yogurt. Beverages: Water. Red wine. Herbal tea. Fats and oils: Extra virgin olive oil. Avocado oil. Grape seed oil. Sweets and desserts: [place] yogurt with honey. Baked apples. Poached pears. Trail mix. Seasoning and other foods: Basil. Cilantro. Coriander. Cumin. Mint.  Parsley. Sage. Rosemary. Tarragon. Garlic. Oregano. Thyme. Pepper. Balsalmic vinegar. Tahini. Hummus. Tomato sauce. Olives. Mushrooms. Limit these Grains: Prepackaged pasta or rice dishes. Prepackaged cereal with added sugar. Vegetables: Deep fried potatoes (french fries). Fruits: Fruit canned in syrup. Meats and other protein foods: Beef. Pork. Lamb. Poultry with skin. Hot dogs. [place] Salines. Dairy: Ice cream. Sour cream. Whole milk. Beverages: Juice. Sugar-sweetened soft drinks. Beer. Liquor and spirits. Fats and oils: Butter. Canola oil. Vegetable oil. Beef fat (tallow). Lard. Sweets and desserts: Cookies. Cakes. Pies. Candy. Seasoning and other foods: Mayonnaise. Premade sauces and marinades. The items listed may not be a complete list. Talk with your dietitian about what dietary choices are right for you. Summary The Mediterranean diet includes both food and lifestyle choices. Eat a variety of fresh fruits and vegetables, beans, nuts, seeds, and whole grains. Limit the amount of red meat and sweets that you eat. Talk with your health care provider about whether it is safe for you to drink red wine in moderation. This means 1 glass a day for nonpregnant women and 2 glasses a day for men. A glass of wine equals 5 oz (150 mL). This information is not intended to replace advice given to you by your health care  provider. Make sure you discuss any questions you have with your health care provider. Document Released: 10/22/2015 Document Revised: 11/24/2015 Document Reviewed: 10/22/2015 Elsevier Interactive Patient Education  2017 [employer].  We have sent a referral to [facility] for your MRI and they will call you directly to schedule your appointment. They are located at [place]. If you need to contact them directly please call [phone].  Your provider has requested that you have labwork completed today. Please go to [facility] ([id number]) on the second floor of  this building before leaving the office today. You do not need to check in. If you are not called within 15 minutes please check with the front desk.

## 2021-05-23 NOTE — Progress Notes (Signed)
Please inform patient of borderline levels of  vitamin B12  Recommend starting on vitamin B12 1000 mcg daily. We usually like the B12 levels to be between [phone], today at 329.  Thyroid levels are normal.  ?Follow-up with the primary care physician. Thank you

## 2021-06-05 ENCOUNTER — Ambulatory Visit
Admission: RE | Admit: 2021-06-05 | Discharge: 2021-06-05 | Disposition: A | Discharge status: Home / Self Care | Payer: Medicare HMO | Source: Ambulatory Visit | Attending: Physician Assistant | Admitting: Physician Assistant

## 2021-06-05 ENCOUNTER — Other Ambulatory Visit: Payer: Self-pay

## 2021-06-05 DIAGNOSIS — R220 Localized swelling, mass and lump, head: Secondary | ICD-10-CM | POA: Diagnosis not present

## 2021-06-05 DIAGNOSIS — R413 Other amnesia: Secondary | ICD-10-CM | POA: Diagnosis not present

## 2021-06-05 DIAGNOSIS — G319 Degenerative disease of nervous system, unspecified: Secondary | ICD-10-CM | POA: Diagnosis not present

## 2021-06-05 DIAGNOSIS — D1779 Benign lipomatous neoplasm of other sites: Secondary | ICD-10-CM | POA: Diagnosis not present

## 2021-06-25 ENCOUNTER — Other Ambulatory Visit: Payer: Self-pay | Admitting: Physician Assistant

## 2021-06-25 ENCOUNTER — Telehealth: Payer: Self-pay | Admitting: Physician Assistant

## 2021-06-25 MED ORDER — MEMANTINE HCL 10 MG PO TABS: ORAL_TABLET | ORAL | 11 refills | Status: DC

## 2021-06-25 NOTE — Telephone Encounter (Signed)
Pt called in and left a message. He would like to speak with someone on what the next steps are now that he's had his MRI and labs.  ?

## 2021-06-25 NOTE — Telephone Encounter (Signed)
Called patient and relayed information about medication and sent it on my chart   ?

## 2021-06-28 ENCOUNTER — Encounter: Payer: Self-pay | Admitting: Cardiology

## 2021-06-28 ENCOUNTER — Encounter: Payer: Self-pay | Admitting: Physician Assistant

## 2021-07-04 ENCOUNTER — Encounter: Payer: Self-pay | Admitting: Physician Assistant

## 2021-07-05 DIAGNOSIS — Z961 Presence of intraocular lens: Secondary | ICD-10-CM | POA: Diagnosis not present

## 2021-07-05 DIAGNOSIS — H43392 Other vitreous opacities, left eye: Secondary | ICD-10-CM | POA: Diagnosis not present

## 2021-07-05 DIAGNOSIS — H26492 Other secondary cataract, left eye: Secondary | ICD-10-CM | POA: Diagnosis not present

## 2021-07-14 DIAGNOSIS — H26492 Other secondary cataract, left eye: Secondary | ICD-10-CM | POA: Diagnosis not present

## 2021-07-17 ENCOUNTER — Other Ambulatory Visit: Payer: Self-pay | Admitting: Physician Assistant

## 2021-07-29 ENCOUNTER — Encounter (INDEPENDENT_AMBULATORY_CARE_PROVIDER_SITE_OTHER): Payer: Self-pay

## 2021-08-04 ENCOUNTER — Encounter (INDEPENDENT_AMBULATORY_CARE_PROVIDER_SITE_OTHER): Payer: Medicare HMO | Admitting: Ophthalmology

## 2021-08-04 ENCOUNTER — Ambulatory Visit (INDEPENDENT_AMBULATORY_CARE_PROVIDER_SITE_OTHER): Payer: Medicare HMO | Admitting: Ophthalmology

## 2021-08-04 ENCOUNTER — Encounter (INDEPENDENT_AMBULATORY_CARE_PROVIDER_SITE_OTHER): Payer: Self-pay | Admitting: Ophthalmology

## 2021-08-04 DIAGNOSIS — H43812 Vitreous degeneration, left eye: Secondary | ICD-10-CM

## 2021-08-04 DIAGNOSIS — H43312 Vitreous membranes and strands, left eye: Secondary | ICD-10-CM

## 2021-08-04 DIAGNOSIS — H43319 Vitreous membranes and strands, unspecified eye: Secondary | ICD-10-CM | POA: Insufficient documentation

## 2021-08-04 HISTORY — DX: Vitreous degeneration, left eye: H43.812

## 2021-08-04 HISTORY — DX: Vitreous membranes and strands, unspecified eye: H43.319

## 2021-08-04 NOTE — Assessment & Plan Note (Signed)
Near center and center involved visual axis vitreous strands membranes moderate impact on acuity at present.  I explained to the patient this is likely to improve follow-up in 4 to 6 weeks or as needed if symptoms worsen or if they do not improve

## 2021-08-04 NOTE — Progress Notes (Signed)
08/04/2021     CHIEF COMPLAINT Patient presents for  Chief Complaint  Patient presents with   Retina Evaluation      HISTORY OF PRESENT ILLNESS: Peter Best is a 81 y.o. male who presents to the clinic today for:   HPI     Retina Evaluation           Associated Symptoms: Flashes and Floaters.  Negative for Distortion, Pain, Redness, Photophobia, Glare, Trauma, Scalp Tenderness, Jaw Claudication, Shoulder/Hip pain, Fever, Weight Loss and Fatigue         Comments   NP- Vitreous debris; OS; Ref by Gershon Crane. Pt stated that, "There are floaters all over my eye it seems as if there is a traffic flag is in a triangular form.  is in the corner at the bottom of left eye periodically. Sometimes I see it in the morning around 11am and in the evening around 5pm. But then it comes and goes. I am not having any pain. It is just annoying." Pt stated new onset symptoms started about 2-3 weeks ago. Pt stated floaters have been going on all of his life. Pt had cataract surgery in both eyes performed by Dr. Gershon Crane. Pt denies pain and FOL at the moment.       Last edited by Silvestre Moment on 08/04/2021  8:37 AM.      Referring physician: Gaynelle Arabian, MD [address],  [place] [address]  HISTORICAL INFORMATION:   Selected notes from the [facility]: No current outpatient medications on file. (Ophthalmic Drugs)   No current facility-administered medications for this visit. (Ophthalmic Drugs)   Current Outpatient Medications (Other)  Medication Sig   acetaminophen (TYLENOL) 500 MG tablet Take 1,000 mg by mouth every 6 (six) hours as needed for mild pain.   atorvastatin (LIPITOR) 20 MG tablet Take 20 mg by mouth daily.   famotidine (PEPCID) 20 MG tablet Take 20 mg by mouth in the morning and at bedtime.   ibuprofen (ADVIL,MOTRIN) 400 MG tablet Take 400 mg by mouth every 6 (six) hours as needed for mild pain.   lisinopril  (ZESTRIL) 20 MG tablet Take 20 mg by mouth daily.   memantine (NAMENDA) 10 MG tablet TAKE 1 TABLET (10 MG AT NIGHT) FOR 2 WEEKS, THEN INCREASE TO 1 TABLET (10 MG) TWICE A DAY   omeprazole (PRILOSEC) 20 MG capsule Take 20 mg by mouth daily.   sertraline (ZOLOFT) 100 MG tablet Take 1 tablet by mouth as needed.   traZODone (DESYREL) 50 MG tablet Take 1 tablet by mouth as needed.   Current Facility-Administered Medications (Other)  Medication Route   0.9 %  sodium chloride infusion Intravenous      REVIEW OF SYSTEMS: ROS   Negative for: Constitutional, Gastrointestinal, Neurological, Skin, Genitourinary, Musculoskeletal, HENT, Endocrine, Cardiovascular, Eyes, Respiratory, Psychiatric, Allergic/Imm, Heme/Lymph Last edited by Silvestre Moment on 08/04/2021  8:35 AM.       ALLERGIES No Known Allergies  PAST MEDICAL HISTORY Past Medical History:  Diagnosis Date   Allergy    Anxiety disorder    BBB (bundle branch block)    Benign prostatic hyperplasia    Cancer (HCC)    skin - on nose and temple   Cataract    just watching - bilateral   Gastroesophageal reflux disease    HA (headache)    otc med prn   Hyperlipidemia    Hypertensive disorder  Inguinal hernia    repaired by surgery   Insomnia    Kidney stones    passed stone - no surgery required   Major depressive disorder    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   COLONOSCOPY W/ POLYPECTOMY     UNCODED SURGICAL HISTORY   HERNIA REPAIR     INGUINAL HERNIA REPAIR     MOHS SURGERY  2018   x x  on nose   MOHS SURGERY     MOHS' CHEMOSURGERY PROCEDURE DATE: 09/30/2010:BCC R INFERIOR TEMPLE, UNCODED SURGICAL HISTORY   TONSILLECTOMY     WISDOM TOOTH EXTRACTION      FAMILY HISTORY Family History  Problem Relation Age of Onset   Colon polyps Neg Hx    Rectal cancer Neg Hx    Stomach cancer Neg Hx      SOCIAL HISTORY Social History   Tobacco Use   Smoking status: Never    Passive exposure: Never   Smokeless tobacco: Never  Vaping Use   Vaping Use: Never used  Substance Use Topics   Alcohol use: Never   Drug use: No         OPHTHALMIC EXAM:  Base Eye Exam     Visual Acuity (ETDRS)       Right Left   Dist [place] 20/20 20/25 -2         Tonometry (Tonopen, 8:42 AM)       Right Left   Pressure 10 14         Pupils       Pupils Dark Light Shape React APD   Right PERRL 3 2 Round Brisk None   Left PERRL 3 2 Round Brisk None         Visual Fields       Left Right    Full Full         Extraocular Movement       Right Left    Full Full         Neuro/Psych     Oriented x3: Yes         Dilation     Left eye: 2.5% Phenylephrine, 1.0% Mydriacyl @ 8:42 AM           Slit Lamp and Fundus Exam     External Exam       Right Left   External Normal Normal         Slit Lamp Exam       Right Left   Lids/Lashes Normal Normal   Conjunctiva/Sclera White and quiet White and quiet   Cornea Clear Clear   Anterior Chamber Deep and quiet Deep and quiet   Iris Round and reactive Round and reactive   Lens Centered posterior chamber intraocular lens, multifocal,,   Centered posterior chamber intraocular lens, multifocal   Anterior Vitreous Normal Normal         Fundus Exam       Right Left   Posterior Vitreous Normal Posterior vitreous detachment, Central vitreous floaters large central vitreous floaters moving in and out of visual axis OS   Disc Normal Normal   C/D Ratio 0.4 0.4   Macula Normal Normal   Vessels Normal Normal   Periphery Normal, no holes or tears Normal, no holes or tears  IMAGING AND PROCEDURES  Imaging and Procedures for 08/04/21  OCT, Retina - OU - Both Eyes       Right Eye Quality was good. Scan locations included subfoveal. Central Foveal Thickness: 291. Progression has no prior data.  Findings include normal foveal contour.   Left Eye Quality was good. Scan locations included subfoveal. Central Foveal Thickness: 283. Progression has no prior data. Findings include normal foveal contour.      Color Fundus Photography Optos - OU - Both Eyes       Right Eye Progression has no prior data. Disc findings include normal observations. Macula : normal observations. Vessels : normal observations. Periphery : normal observations.   Left Eye Progression has no prior data. Disc findings include normal observations. Macula : normal observations. Vessels : normal observations. Periphery : normal observations.              ASSESSMENT/PLAN:  Posterior vitreous detachment of left eye Recent PVD OS but no holes or tears careful  examination 25 D and 20 D exam yet with vitreous debris centrally  Vitreous membranes and strands Near center and center involved visual axis vitreous strands membranes moderate impact on acuity at present.  I explained to the patient this is likely to improve follow-up in 4 to 6 weeks or as needed if symptoms worsen or if they do not improve     ICD-10-CM   1. Vitreous membranes and strands of left eye  H43.312 OCT, Retina - OU - Both Eyes    Color Fundus Photography Optos - OU - Both Eyes    2. Posterior vitreous detachment of left eye  H43.812       1.  OS with new onset PVD, with residual vitreous strands and membranes vitreous floaters in the paracentral and central visual axis, with some impact on acuity yet I explained to the patient this is likely improve over time.  2.  OD not dilated per request  3.  Follow-up and if symptoms do not improve may consider surgical intervention.  We had lengthy discussion regarding YAG vitreal lysis however these tend to simply to make the number of floaters more numerous while certainly mildly less opaque but they are not covered by insurance or by Medicare.  Moreover YAG vitreal lysis still attendant  risk of retinal tears and holes developing in the peripheral retina  Ophthalmic Meds Ordered this visit:  No orders of the defined types were placed in this encounter.      Return in about 6 weeks (around 09/15/2021) for dilate, OS.  There are no Patient Instructions on file for this visit.   Explained the diagnoses, plan, and follow up with the patient and they expressed understanding.  Patient expressed understanding of the importance of proper follow up care.   Clent Demark Abdoulie Tierce M.D. Diseases & Surgery of the Retina and Vitreous Retina [facility] Diabetic [place] 08/04/21     Abbreviations: M myopia (nearsighted); A astigmatism; H hyperopia (farsighted); P presbyopia; Mrx spectacle prescription;  CTL contact lenses; OD right eye; OS left eye; OU both eyes  XT exotropia; ET esotropia; PEK punctate epithelial keratitis; PEE punctate epithelial erosions; DES dry eye syndrome; MGD meibomian gland dysfunction; ATs artificial tears; PFAT's preservative free artificial tears; [place] nuclear sclerotic cataract; PSC posterior subcapsular cataract; ERM epi-retinal membrane; PVD posterior vitreous detachment; RD retinal detachment; DM diabetes mellitus; DR diabetic retinopathy; NPDR non-proliferative diabetic retinopathy; PDR proliferative diabetic retinopathy; CSME clinically significant macular edema; DME diabetic macular edema; dbh  dot blot hemorrhages; CWS cotton wool spot; POAG primary open angle glaucoma; C/D cup-to-disc ratio; HVF humphrey visual field; GVF goldmann visual field; OCT optical coherence tomography; IOP intraocular pressure; BRVO Branch retinal vein occlusion; CRVO central retinal vein occlusion; CRAO central retinal artery occlusion; BRAO branch retinal artery occlusion; RT retinal tear; SB scleral buckle; PPV pars plana vitrectomy; VH Vitreous hemorrhage; PRP panretinal laser photocoagulation; IVK intravitreal kenalog; VMT vitreomacular traction; MH Macular hole;  NVD neovascularization of  the disc; NVE neovascularization elsewhere; AREDS age related eye disease study; ARMD age related macular degeneration; POAG primary open angle glaucoma; EBMD epithelial/anterior basement membrane dystrophy; ACIOL anterior chamber intraocular lens; IOL intraocular lens; PCIOL posterior chamber intraocular lens; Phaco/IOL phacoemulsification with intraocular lens placement; [place] photorefractive keratectomy; LASIK laser assisted in situ keratomileusis; HTN hypertension; DM diabetes mellitus; COPD chronic obstructive pulmonary disease

## 2021-08-04 NOTE — Assessment & Plan Note (Signed)
Recent PVD OS but no holes or tears careful  examination 25 D and 20 D exam yet with vitreous debris centrally

## 2021-08-23 ENCOUNTER — Ambulatory Visit: Payer: Medicare HMO | Admitting: Physician Assistant

## 2021-08-23 ENCOUNTER — Encounter: Payer: Self-pay | Admitting: Physician Assistant

## 2021-08-23 VITALS — BP 121/70 | HR 80 | Resp 18 | Ht 71.0 in | Wt 165.0 lb

## 2021-08-23 DIAGNOSIS — G3184 Mild cognitive impairment, so stated: Secondary | ICD-10-CM | POA: Diagnosis not present

## 2021-08-23 NOTE — Progress Notes (Addendum)
Assessment/Plan:   MIld COgnitive Impairment likely due to Alzheimer's disease   Peter Best is a very pleasant 81 y.o. year old RH male with  a history of hypertension, hyperlipidemia, insomnia, osteoarthritis, right bundle branch block, anxiety, depression, chronic GI issues including diarrhea, seen today for evaluation of memory loss. MoCA on 05/21/21 was  20/30.  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  Neurocognitive testing is pending.  He is on memantine 10 mg twice daily, tolerating well.   Recommendations:  Continue memantine 10 mg  twice daily Side effects were discussed Continue B12 replenishment Follow up in  6 months. Keep neurocognitive testing ion 10/2021    Case discussed with Dr. Tomi Likens who agrees with the plan      Subjective:    Peter Best is a very pleasant 81 y.o. RH male  seen today in follow up for memory loss. This patient is here alone.  Previous records as well as any outside records available were reviewed prior to todays visit.    Any changes in memory since last visit?  Patient denies.  He feels that "memantine may be helping to maintain my memory ".  He continues to do crossword puzzles, remain active, trying to stimulate the brain activities.  Occasionally, he has "off days "in which he notices that his memory is not as "sharp ".  "Some days are more alert than others ".  He notices that when he takes his sleeping pill, he may feel more "foggy in the morning ". Patient lives with: Girlfriend at [employer]  repeats oneself?  Occasionally he admits to repeating himself Disoriented when walking into a room?  Patient denies   Leaving objects in unusual places?  Patient denies   Ambulates  with difficulty?   Patient denies   Recent falls?  Patient denies   Any head injuries?  Patient denies   History of seizures?   Patient denies   Wandering behavior?  Patient  denies   Patient drives?   Patient reports driving without any difficulties, denies getting lost. Any mood changes? "one in particular" had a Prom Night, at [facility], but I had a moment of anxiety, and at the same time and feeling almost off and down, this was temporarily, it sends me to "out of place, into her mood ".  After a while, I was able to enjoy the rest of the evening without any issues.  He feels that the inability to communicate as before, may affect his mood.  Decided to go and didn't enjoy it, I thought I was out of place , sent me into a mood. Any history of depression?:  Patient denies   Hallucinations?  Patient denies   Paranoia?  Patient denies   Patient reports that he sleeps well without vivid dreams, REM behavior or sleepwalking  History of sleep apnea?  Patient denies   Any hygiene concerns?  Patient denies   Independent of bathing and dressing?  Endorsed  Does the patient needs help with medications?  Patient is in charge  who is in charge of the finances?  Patient is in charge   Any changes in appetite?  Patient is eating heart healthy meals, and fish, and he has incorporated Mediterranean diet to his regimen. Patient have trouble swallowing? Patient denies   Does the patient cook?  Patient denies   Any kitchen accidents such as leaving the stove on? Patient denies  Any headaches?  Patient denies   The double vision? Patient denies   Any focal numbness or tingling?  Patient denies   Chronic back pain Patient denies   Unilateral weakness?  Patient denies   Any tremors?  Patient denies   Any history of anosmia?  Patient denies   Any incontinence of urine?  Patient denies   Any bowel dysfunction?   Patient denies       Initial visit March 2023 the patient is seen in neurologic consultation at the request of [name], [name], MD for the evaluation of memory.  The patient is here alone.  This is a 81 y.o. year old RH  male who has had memory issues for about 2  years, worse over the last 4 or 5 months.  He states that his main complaint is recalling a word, "I cannot pull it out of the bag right away, and the neighbor told me that he may be a normal aging or not ".  He states that he is more conscious about his memory issues since he turned 81 years old.  He denies repeating himself or being disoriented when walking into her room or leaving objects in unusual places.  He ambulates without difficulty around the community, continues to walk and uses an exercise machine.  He lives in [place] and he is originally from [place], he moved here upon retirement for more tranquil life.  He lives alone but he has a girlfriend.  He denies any falls other than one in October 2022 when he fractured his right elbow which is "still healing ". Last head injury was about 40 years ago in [place] when he fell on ice and hit the back of his head on asphalt without LOC.  His mood is overall good, without irritability.  He has a history of depression, well controlled with sertraline.  He likes to do word finding, he does not enjoy much board games or other activities.  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  He has intermittent years of insomnia, denies vivid dreams or sleepwalking, hallucinations or paranoia.  He does not feel rested, he reports that his level of energy is decreased, at times he feels sleepy.  His appetite is fair, denies trouble swallowing, he does not cook.  He denies any recent headaches, but he has a history of migraines, periodically he has an event, without aura, and is treated with Tylenol.  He denies double vision, dizziness, focal numbness or tingling, unilateral weakness, tremors or anosmia.  No history of seizures.  He denies urine  incontinence, but he does not like to drink enough water, because he does not to urinate that frequently.  He denies constipation.  He has on and off issues with diarrhea.  He denies sleep apnea, alcohol, or tobacco history.  Family history for dementia in his father (unknown type)   Labs December 2022 shows triglycerides 231, total cholesterol 172, LDL 97,   No Known Allergies  MRI brain 4/20231. No acute intracranial abnormality.2. Advanced cerebral atrophy.3. Left cerebellopontine angle lipoma.  Past Medical History:  Diagnosis Date   Allergy    Anxiety disorder    BBB (bundle branch block)    Benign prostatic hyperplasia  Cancer ([place])    skin - on nose and temple   Cataract    just watching - bilateral   Gastroesophageal reflux disease    HA (headache)    otc med prn   Hyperlipidemia    Hypertensive disorder    Inguinal hernia    repaired by surgery   Insomnia    Kidney stones    passed stone - no surgery required   Major depressive disorder    Osteoarthritis    OF THE HIP and mid back     Past Surgical History:  Procedure Laterality Date   COLONOSCOPY W/ POLYPECTOMY  09/2014   polyps  - done in [place]   COLONOSCOPY W/ POLYPECTOMY     UNCODED SURGICAL HISTORY   HERNIA REPAIR     INGUINAL HERNIA REPAIR     MOHS SURGERY  2018   x x  on nose   MOHS SURGERY     MOHS' CHEMOSURGERY PROCEDURE DATE: 09/30/2010:BCC R INFERIOR TEMPLE, UNCODED SURGICAL HISTORY   TONSILLECTOMY     WISDOM TOOTH EXTRACTION       PREVIOUS MEDICATIONS:   CURRENT MEDICATIONS:  Outpatient Encounter Medications as of 08/23/2021  Medication Sig   acetaminophen (TYLENOL) 500 MG tablet Take 1,000 mg by mouth every 6 (six) hours as needed for mild pain.   atorvastatin (LIPITOR) 20 MG tablet Take 20 mg by mouth daily.   ibuprofen (ADVIL,MOTRIN) 400 MG tablet Take 400 mg by mouth every 6 (six) hours as needed for mild pain.   lisinopril (ZESTRIL) 20 MG tablet Take 20 mg by mouth daily.    memantine (NAMENDA) 10 MG tablet TAKE 1 TABLET (10 MG AT NIGHT) FOR 2 WEEKS, THEN INCREASE TO 1 TABLET (10 MG) TWICE A DAY   omeprazole (PRILOSEC) 20 MG capsule Take 20 mg by mouth daily.   sertraline (ZOLOFT) 100 MG tablet Take 1 tablet by mouth as needed.   traZODone (DESYREL) 50 MG tablet Take 1 tablet by mouth as needed.   [DISCONTINUED] famotidine (PEPCID) 20 MG tablet Take 20 mg by mouth in the morning and at bedtime.   Facility-Administered Encounter Medications as of 08/23/2021  Medication   0.9 %  sodium chloride infusion     Objective:     PHYSICAL EXAMINATION:    VITALS:   Vitals:   08/23/21 1242  BP: 121/70  Pulse: 80  Resp: 18  SpO2: 95%  Weight: 165 lb (74.8 kg)  Height: '5\' 11"'$  (1.803 m)    GEN:  The patient appears stated age and is in NAD. HEENT:  Normocephalic, atraumatic.   Neurological examination:  General: NAD, well-groomed, appears stated age. Orientation: The patient is alert. Oriented to person, place and date Cranial nerves: There is good facial symmetry.The speech is fluent and clear. No aphasia or dysarthria. Fund of knowledge is appropriate. Recent memory impaired and remote memory is normal.  Attention and concentration are normal.  Able to name objects and repeat phrases.  Hearing is intact to conversational tone.    Sensation: Sensation is intact to light touch throughout Motor: Strength is at least antigravity x4. Tremors: none  DTR's 2/4 in UE/LE      05/21/2021    1:00 PM  [facility]real Cognitive Assessment   Visuospatial/ Executive (0/5) 1  Naming (0/3) 3  Attention: Read list of digits (0/2) 2  Attention: Read list of letters (0/1) 1  Attention: Serial 7 subtraction starting at 100 (0/3) 2  Language: Repeat phrase (0/2) 2  Language :  Fluency (0/1) 1  Abstraction (0/2) 1  Delayed Recall (0/5) 1  Orientation (0/6) 6  Total 20        No data to display             Movement examination: Tone: There is normal tone in the  UE/LE Abnormal movements:  no tremor.  No myoclonus.  No asterixis.   Coordination:  There is no decremation with RAM's. Normal finger to nose  Gait and Station: The patient has no difficulty arising out of a deep-seated chair without the use of the hands. The patient's stride length is good.  Gait is cautious and narrow.   Thank you for allowing [place] the opportunity to participate in the care of this nice patient. Please do not hesitate to contact us for any questions or concerns.   Total time spent on today's visit was 40 minutes dedicated to this patient today, preparing to see patient, examining the patient, ordering tests and/or medications and counseling the patient, documenting clinical information in the EHR or other health record, independently interpreting results and communicating results to the patient/family, discussing treatment and goals, answering patient's questions and coordinating care.  Cc:  Gaynelle Arabian, MD  [name] 08/24/2021 7:35 AM

## 2021-08-23 NOTE — Patient Instructions (Addendum)
It was a pleasure to see you today at our office.   Recommendations:  Follow up on Dec 12 at 11:30  Continue Memantine 10 mg twice daily.Side effects were discussed  Continue B12 daily      Whom to call:  Memory  decline, memory medications: Call our office [phone]   For psychiatric meds, mood meds: Please have your primary care physician manage these medications.   Counseling regarding caregiver distress, including caregiver depression, anxiety and issues regarding community resources, adult day care programs, adult living facilities, or memory care questions:   Feel free to contact Wiley, Social Worker at [phone]   For assessment of decision of mental capacity and competency:  Call Dr. Anthoney Harada, geriatric psychiatrist at [phone]  For guidance in geriatric dementia issues please call [facility] [phone]  For guidance regarding [facility] and if placement were needed at the facility, contact Arnell Asal, Social Worker tel: [phone]  If you have any severe symptoms of a stroke, or other severe issues such as confusion,severe chills or fever, etc call 911 or go to the ER as you may need to be evaluated further     Feel free to go to the following database for funded clinical studies conducted around the world: http://saunders.com/   https://www.triadclinicaltrials.com/     RECOMMENDATIONS FOR ALL PATIENTS WITH MEMORY PROBLEMS: 1. Continue to exercise (Recommend 30 minutes of walking everyday, or 3 hours every week) 2. Increase social interactions - continue going to [place] and enjoy social gatherings with friends and family 3. Eat healthy, avoid fried foods and eat more fruits and vegetables 4. Maintain adequate blood pressure, blood sugar, and blood cholesterol level. Reducing the risk of stroke and cardiovascular disease also helps promoting better memory. 5. Avoid stressful situations.  Live a simple life and avoid aggravations. Organize your time and prepare for the next day in anticipation. 6. Sleep well, avoid any interruptions of sleep and avoid any distractions in the bedroom that may interfere with adequate sleep quality 7. Avoid sugar, avoid sweets as there is a strong link between excessive sugar intake, diabetes, and cognitive impairment We discussed the Mediterranean diet, which has been shown to help patients reduce the risk of progressive memory disorders and reduces cardiovascular risk. This includes eating fish, eat fruits and green leafy vegetables, nuts like almonds and hazelnuts, walnuts, and also use olive oil. Avoid fast foods and fried foods as much as possible. Avoid sweets and sugar as sugar use has been linked to worsening of memory function.  There is always a concern of gradual progression of memory problems. If this is the case, then we may need to adjust level of care according to patient needs. Support, both to the patient and caregiver, should then be put into place.    The Alzheimer's Association is here all day, every day for people facing Alzheimer's disease through our free 24/7 Helpline: [phone]. The Helpline provides reliable information and support to all those who need assistance, such as individuals living with memory loss, Alzheimer's or other dementia, caregivers, health care professionals and the public.  Our highly trained and knowledgeable staff can help you with: Understanding memory loss, dementia and Alzheimer's  Medications and other treatment options  General information about aging and brain health  Skills to provide quality care and to find the best care from professionals  Legal, financial and living-arrangement decisions Our Helpline also features: Confidential care consultation provided by master's level clinicians who can  help with decision-making support, crisis assistance and education on issues families face every day   Help in a caller's preferred language using our translation service that features more than 200 languages and dialects  Referrals to local community programs, services and ongoing support     FALL PRECAUTIONS: Be cautious when walking. Scan the area for obstacles that may increase the risk of trips and falls. When getting up in the mornings, sit up at the edge of the bed for a few minutes before getting out of bed. Consider elevating the bed at the head end to avoid drop of blood pressure when getting up. Walk always in a well-lit room (use night lights in the walls). Avoid area rugs or power cords from appliances in the middle of the walkways. Use a walker or a cane if necessary and consider physical therapy for balance exercise. Get your eyesight checked regularly.  FINANCIAL OVERSIGHT: Supervision, especially oversight when making financial decisions or transactions is also recommended.  HOME SAFETY: Consider the safety of the kitchen when operating appliances like stoves, microwave oven, and blender. Consider having supervision and share cooking responsibilities until no longer able to participate in those. Accidents with firearms and other hazards in the house should be identified and addressed as well.   ABILITY TO BE LEFT ALONE: If patient is unable to contact 911 operator, consider using LifeLine, or when the need is there, arrange for someone to stay with patients. Smoking is a fire hazard, consider supervision or cessation. Risk of wandering should be assessed by caregiver and if detected at any point, supervision and safe proof recommendations should be instituted.  MEDICATION SUPERVISION: Inability to self-administer medication needs to be constantly addressed. Implement a mechanism to ensure safe administration of the medications.   DRIVING: Regarding driving, in patients with progressive memory problems, driving will be impaired. We advise to have someone else do the driving if  trouble finding directions or if minor accidents are reported. Independent driving assessment is available to determine safety of driving.   If you are interested in the driving assessment, you can contact the following:  [employer] in [place]  [place] [phone] or [phone]      [place] refers to food and lifestyle choices that are based on the traditions of countries located on the [employer]. This way of eating has been shown to help prevent certain conditions and improve outcomes for people who have chronic diseases, like kidney disease and heart disease. What are tips for following this plan? Lifestyle  Cook and eat meals together with your family, when possible. Drink enough fluid to keep your urine clear or pale yellow. Be physically active every day. This includes: Aerobic exercise like running or swimming. Leisure activities like gardening, walking, or housework. Get 7-8 hours of sleep each night. If recommended by your health care provider, drink red wine in moderation. This means 1 glass a day for nonpregnant women and 2 glasses a day for men. A glass of wine equals 5 oz (150 mL). Reading food labels  Check the serving size of packaged foods. For foods such as rice and pasta, the serving size refers to the amount of cooked product, not dry. Check the total fat in packaged foods. Avoid foods that have saturated fat or trans fats. Check the ingredients list for added sugars, such as corn syrup. Shopping  At the grocery store, buy  most of your food from the areas near the walls of the store. This includes: Fresh fruits and vegetables (produce). Grains, beans, nuts, and seeds. Some of these may be available in unpackaged forms or large amounts (in bulk). Fresh seafood. Poultry and eggs. Low-fat dairy  products. Buy whole ingredients instead of prepackaged foods. Buy fresh fruits and vegetables in-season from local farmers markets. Buy frozen fruits and vegetables in resealable bags. If you do not have access to quality fresh seafood, buy precooked frozen shrimp or canned fish, such as tuna, salmon, or sardines. Buy small amounts of raw or cooked vegetables, salads, or olives from the deli or salad bar at your store. Stock your pantry so you always have certain foods on hand, such as olive oil, canned tuna, canned tomatoes, rice, pasta, and beans. Cooking  Cook foods with extra-virgin olive oil instead of using butter or other vegetable oils. Have meat as a side dish, and have vegetables or grains as your main dish. This means having meat in small portions or adding small amounts of meat to foods like pasta or stew. Use beans or vegetables instead of meat in common dishes like chili or lasagna. Experiment with different cooking methods. Try roasting or broiling vegetables instead of steaming or sauteing them. Add frozen vegetables to soups, stews, pasta, or rice. Add nuts or seeds for added healthy fat at each meal. You can add these to yogurt, salads, or vegetable dishes. Marinate fish or vegetables using olive oil, lemon juice, garlic, and fresh herbs. Meal planning  Plan to eat 1 vegetarian meal one day each week. Try to work up to 2 vegetarian meals, if possible. Eat seafood 2 or more times a week. Have healthy snacks readily available, such as: Vegetable sticks with hummus. Greek yogurt. Fruit and nut trail mix. Eat balanced meals throughout the week. This includes: Fruit: 2-3 servings a day Vegetables: 4-5 servings a day Low-fat dairy: 2 servings a day Fish, poultry, or lean meat: 1 serving a day Beans and legumes: 2 or more servings a week Nuts and seeds: 1-2 servings a day Whole grains: 6-8 servings a day Extra-virgin olive oil: 3-4 servings a day Limit red meat and sweets  to only a few servings a month What are my food choices? Mediterranean diet Recommended Grains: Whole-grain pasta. Brown rice. Bulgar wheat. Polenta. Couscous. Whole-wheat bread. [place]. Vegetables: Artichokes. Beets. Broccoli. Cabbage. Carrots. Eggplant. Green beans. Chard. Kale. Spinach. Onions. Leeks. Peas. Squash. Tomatoes. Peppers. Radishes. Fruits: Apples. Apricots. Avocado. Berries. Bananas. Cherries. Dates. Figs. Grapes. Lemons. Melon. Oranges. Peaches. Plums. Pomegranate. Meats and other protein foods: Beans. Almonds. Sunflower seeds. Pine nuts. Peanuts. [place]. Salmon. Scallops. Shrimp. Frio. Tilapia. Clams. Oysters. Eggs. Dairy: Low-fat milk. Cheese. Greek yogurt. Beverages: Water. Red wine. Herbal tea. Fats and oils: Extra virgin olive oil. Avocado oil. Grape seed oil. Sweets and desserts: [place] yogurt with honey. Baked apples. Poached pears. Trail mix. Seasoning and other foods: Basil. Cilantro. Coriander. Cumin. Mint. Parsley. Sage. Rosemary. Tarragon. Garlic. Oregano. Thyme. Pepper. Balsalmic vinegar. Tahini. Hummus. Tomato sauce. Olives. Mushrooms. Limit these Grains: Prepackaged pasta or rice dishes. Prepackaged cereal with added sugar. Vegetables: Deep fried potatoes (french fries). Fruits: Fruit canned in syrup. Meats and other protein foods: Beef. Pork. Lamb. Poultry with skin. Hot dogs. [place] Salines. Dairy: Ice cream. Sour cream. Whole milk. Beverages: Juice. Sugar-sweetened soft drinks. Beer. Liquor and spirits. Fats and oils: Butter. Canola oil. Vegetable oil. Beef fat (tallow). Lard. Sweets and desserts: Cookies. Cakes. Pies. Candy. Seasoning and  other foods: Mayonnaise. Premade sauces and marinades. The items listed may not be a complete list. Talk with your dietitian about what dietary choices are right for you. Summary The Mediterranean diet includes both food and lifestyle choices. Eat a variety of fresh fruits and vegetables, beans, nuts, seeds, and whole  grains. Limit the amount of red meat and sweets that you eat. Talk with your health care provider about whether it is safe for you to drink red wine in moderation. This means 1 glass a day for nonpregnant women and 2 glasses a day for men. A glass of wine equals 5 oz (150 mL). This information is not intended to replace advice given to you by your health care provider. Make sure you discuss any questions you have with your health care provider. Document Released: 10/22/2015 Document Revised: 11/24/2015 Document Reviewed: 10/22/2015 Elsevier Interactive Patient Education  2017 [employer].

## 2021-08-24 ENCOUNTER — Encounter: Payer: Self-pay | Admitting: Physician Assistant

## 2021-08-24 DIAGNOSIS — G3184 Mild cognitive impairment, so stated: Secondary | ICD-10-CM

## 2021-08-24 DIAGNOSIS — F0280 Dementia in other diseases classified elsewhere without behavioral disturbance: Secondary | ICD-10-CM | POA: Insufficient documentation

## 2021-08-24 HISTORY — DX: Mild cognitive impairment of uncertain or unknown etiology: G31.84

## 2021-08-24 NOTE — Telephone Encounter (Signed)
Glad he is doing well. He can take it before going to sleep because will make him a little sleepy. Glad to hear about the resarch studies, hope he qualifies. Have a wonderful day

## 2021-09-15 ENCOUNTER — Ambulatory Visit (INDEPENDENT_AMBULATORY_CARE_PROVIDER_SITE_OTHER): Payer: Medicare HMO | Admitting: Ophthalmology

## 2021-09-15 ENCOUNTER — Encounter (INDEPENDENT_AMBULATORY_CARE_PROVIDER_SITE_OTHER): Payer: Self-pay | Admitting: Ophthalmology

## 2021-09-15 DIAGNOSIS — H43812 Vitreous degeneration, left eye: Secondary | ICD-10-CM

## 2021-09-15 DIAGNOSIS — H43312 Vitreous membranes and strands, left eye: Secondary | ICD-10-CM

## 2021-09-15 NOTE — Patient Instructions (Signed)
Patient instructed to contact the office promptly for a brand-new large number of floaters or associated flashing lights or curtain of darkness developing

## 2021-09-15 NOTE — Assessment & Plan Note (Signed)
No holes or tears 

## 2021-09-15 NOTE — Assessment & Plan Note (Signed)
The nature of vitreous floaters was discussed with the patient as well as their frequent occurrence with development of posterior vitreous detachment. The significance of flashes and field cuts was discussed with the patient. The need for a dilated fundus examination was discussed and advice given to return immediately for new or different floaters .  More uncommonly, vitreous floaters, debris, or clouds of haze may develop with impact on visual functioning.  If the personal impact to the patient is minor, observation usually results in diminishing symptoms.  If visual impact hampers activity of daily living over a period of time, medical interventions are available to change the condition, including laser vitreolysis or more commonly, surgical intervention to remove the visual impactful floaters.  No holes or tears

## 2021-09-15 NOTE — Progress Notes (Addendum)
09/15/2021     CHIEF COMPLAINT Patient presents for  Chief Complaint  Patient presents with   Flashes/floaters      HISTORY OF PRESENT ILLNESS: Peter Best is a 81 y.o. male who presents to the clinic today for:   HPI     Flashes/floaters           Laterality: left eye       Last edited by Hurman Horn, MD on 09/15/2021  8:43 AM.      Referring physician: Gaynelle Arabian, MD [address],  [place] [address]  HISTORICAL INFORMATION:   Selected notes from the [facility]: No current outpatient medications on file. (Ophthalmic Drugs)   No current facility-administered medications for this visit. (Ophthalmic Drugs)   Current Outpatient Medications (Other)  Medication Sig   acetaminophen (TYLENOL) 500 MG tablet Take 1,000 mg by mouth every 6 (six) hours as needed for mild pain.   atorvastatin (LIPITOR) 20 MG tablet Take 20 mg by mouth daily.   ibuprofen (ADVIL,MOTRIN) 400 MG tablet Take 400 mg by mouth every 6 (six) hours as needed for mild pain.   lisinopril (ZESTRIL) 20 MG tablet Take 20 mg by mouth daily.   memantine (NAMENDA) 10 MG tablet TAKE 1 TABLET (10 MG AT NIGHT) FOR 2 WEEKS, THEN INCREASE TO 1 TABLET (10 MG) TWICE A DAY   omeprazole (PRILOSEC) 20 MG capsule Take 20 mg by mouth daily.   sertraline (ZOLOFT) 100 MG tablet Take 1 tablet by mouth as needed.   traZODone (DESYREL) 50 MG tablet Take 1 tablet by mouth as needed.   Current Facility-Administered Medications (Other)  Medication Route   0.9 %  sodium chloride infusion Intravenous      REVIEW OF SYSTEMS: ROS   Negative for: Constitutional, Gastrointestinal, Neurological, Skin, Genitourinary, Musculoskeletal, HENT, Endocrine, Cardiovascular, Eyes, Respiratory, Psychiatric, Allergic/Imm, Heme/Lymph Last edited by Hurman Horn, MD on 09/15/2021  8:43 AM.       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   COLONOSCOPY W/ POLYPECTOMY     UNCODED SURGICAL HISTORY   HERNIA REPAIR     INGUINAL HERNIA REPAIR     MOHS SURGERY  2018   x x  on nose   MOHS SURGERY     MOHS' CHEMOSURGERY PROCEDURE DATE: 09/30/2010:BCC R INFERIOR TEMPLE, UNCODED SURGICAL HISTORY   TONSILLECTOMY     WISDOM TOOTH EXTRACTION      FAMILY HISTORY Family History  Problem Relation Age of Onset   Colon polyps Neg Hx    Rectal cancer Neg Hx  Stomach cancer Neg Hx     SOCIAL HISTORY Social History   Tobacco Use   Smoking status: Never    Passive exposure: Never   Smokeless tobacco: Never  Vaping Use   Vaping Use: Never used  Substance Use Topics   Alcohol use: Never   Drug use: No         OPHTHALMIC EXAM:  Base Eye Exam     Visual Acuity (ETDRS)       Right Left   Dist [place] 20/20 20/25 -2         Tonometry (Tonopen, 9:37 AM)       Right Left   Pressure 11 14         Pupils       Pupils APD   Right PERRL None   Left PERRL None         Visual Fields       Left Right    Full Full         Extraocular Movement       Right Left    Full, Ortho Full, Ortho         Neuro/Psych     Oriented x3: Yes   Mood/Affect: Normal         Dilation     Left eye: 1.0% Mydriacyl, 2.5% Phenylephrine @ 8:45 AM           Slit Lamp and Fundus Exam     External  Exam       Right Left   External Normal Normal         Slit Lamp Exam       Right Left   Lids/Lashes Normal Normal   Conjunctiva/Sclera White and quiet White and quiet   Cornea Clear Clear   Anterior Chamber Deep and quiet Deep and quiet   Iris Round and reactive Round and reactive   Lens Centered posterior chamber intraocular lens, multifocal,,   Centered posterior chamber intraocular lens, multifocal   Anterior Vitreous Normal Normal         Fundus Exam       Right Left   Posterior Vitreous  Posterior vitreous detachment, Central vitreous floaters large central vitreous floaters moving in and out of visual axis OS, 25d, 20d, and 90d exam to ora, no breaks   Disc  Normal   C/D Ratio  0.4   Macula  Normal   Vessels  Normal   Periphery  no holes or tears, , 25d, 20d, and 90d exam to ora, no breaks            IMAGING AND PROCEDURES  Imaging and Procedures for 09/15/21  Color Fundus Photography Optos - OU - Both Eyes       Right Eye Progression has no prior data. Disc findings include normal observations. Macula : normal observations. Vessels : normal observations. Periphery : normal observations.   Left Eye Progression has no prior data. Disc findings include normal observations. Macula : normal observations. Vessels : normal observations. Periphery : normal observations.   Notes No holes or tears, incidental PVD noted             ASSESSMENT/PLAN:  Vitreous membranes and strands The nature of vitreous floaters was discussed with the patient as well as their frequent occurrence with development of posterior vitreous detachment. The significance of flashes and field cuts was discussed with the patient. The need for a dilated fundus examination was discussed and advice given to return immediately  for new or different floaters .  More uncommonly, vitreous floaters, debris, or clouds of haze may develop with impact on visual functioning.  If the personal impact  to the patient is minor, observation usually results in diminishing symptoms.  If visual impact hampers activity of daily living over a period of time, medical interventions are available to change the condition, including laser vitreolysis or more commonly, surgical intervention to remove the visual impactful floaters.  No holes or tears  Posterior vitreous detachment of left eye No holes or tears     ICD-10-CM   1. Vitreous membranes and strands of left eye  H43.312 Color Fundus Photography Optos - OU - Both Eyes    2. Posterior vitreous detachment of left eye  H43.812 Color Fundus Photography Optos - OU - Both Eyes      1.  OS, PVD, with typical symptoms slightly improved.  No new symptoms.  2.  OS no sign of retinal hole or tear on careful examination to the ora serrata.  Patient instructed to contact the office promptly for a brand-new large number of floaters or associated flashing lights or curtain of darkness developing  3.  Ophthalmic Meds Ordered this visit:  No orders of the defined types were placed in this encounter.      Return if symptoms worsen or fail to improve, for As scheduled  Dr. Gershon Crane.  Patient Instructions  Patient instructed to contact the office promptly for a brand-new large number of floaters or associated flashing lights or curtain of darkness developing   Explained the diagnoses, plan, and follow up with the patient and they expressed understanding.  Patient expressed understanding of the importance of proper follow up care.   Clent Demark Roark Rufo M.D. Diseases & Surgery of the Retina and Vitreous [facility]ina [facility] [place] 09/15/21     Abbreviations: M myopia (nearsighted); A astigmatism; H hyperopia (farsighted); P presbyopia; Mrx spectacle prescription;  CTL contact lenses; OD right eye; OS left eye; OU both eyes  XT exotropia; ET esotropia; PEK punctate epithelial keratitis; PEE punctate epithelial erosions; DES dry eye syndrome; MGD  meibomian gland dysfunction; ATs artificial tears; PFAT's preservative free artificial tears; [place] nuclear sclerotic cataract; PSC posterior subcapsular cataract; ERM epi-retinal membrane; PVD posterior vitreous detachment; RD retinal detachment; DM diabetes mellitus; DR diabetic retinopathy; NPDR non-proliferative diabetic retinopathy; PDR proliferative diabetic retinopathy; CSME clinically significant macular edema; DME diabetic macular edema; dbh dot blot hemorrhages; CWS cotton wool spot; POAG primary open angle glaucoma; C/D cup-to-disc ratio; HVF humphrey visual field; GVF goldmann visual field; OCT optical coherence tomography; IOP intraocular pressure; BRVO Branch retinal vein occlusion; CRVO central retinal vein occlusion; CRAO central retinal artery occlusion; BRAO branch retinal artery occlusion; RT retinal tear; SB scleral buckle; PPV pars plana vitrectomy; VH Vitreous hemorrhage; PRP panretinal laser photocoagulation; IVK intravitreal kenalog; VMT vitreomacular traction; MH Macular hole;  NVD neovascularization of the disc; NVE neovascularization elsewhere; AREDS age related eye disease study; ARMD age related macular degeneration; POAG primary open angle glaucoma; EBMD epithelial/anterior basement membrane dystrophy; ACIOL anterior chamber intraocular lens; IOL intraocular lens; PCIOL posterior chamber intraocular lens; Phaco/IOL phacoemulsification with intraocular lens placement; [place] photorefractive keratectomy; LASIK laser assisted in situ keratomileusis; HTN hypertension; DM diabetes mellitus; COPD chronic obstructive pulmonary disease

## 2021-09-23 ENCOUNTER — Ambulatory Visit: Payer: Medicare HMO | Admitting: Orthopedic Surgery

## 2021-09-24 ENCOUNTER — Encounter: Payer: Self-pay | Admitting: Physician Assistant

## 2021-09-28 ENCOUNTER — Ambulatory Visit: Payer: Medicare HMO | Admitting: Orthopedic Surgery

## 2021-09-28 DIAGNOSIS — M65311 Trigger thumb, right thumb: Secondary | ICD-10-CM

## 2021-09-28 HISTORY — DX: Trigger thumb, right thumb: M65.311

## 2021-09-28 MED ORDER — LIDOCAINE HCL 1 % IJ SOLN
1.0000 mL | INTRAMUSCULAR | Status: AC | PRN
  Administered 2021-09-28: 1 mL

## 2021-09-28 MED ORDER — BETAMETHASONE SOD PHOS & ACET 6 (3-3) MG/ML IJ SUSP
6.0000 mg | INTRAMUSCULAR | Status: AC | PRN
  Administered 2021-09-28: 6 mg via INTRA_ARTICULAR

## 2021-09-28 NOTE — Progress Notes (Signed)
Office Visit Note   Patient: Peter Best           Date of Birth: 11-15-1940           MRN: [id number] Visit Date: 09/28/2021              Requested by: [name], MD [address] [place],  [place] [address] PCP: [name], MD   Assessment & Plan: Visit Diagnoses:  1. Trigger thumb, right thumb     Plan: Patient has triggering of his right thumb that is constant and quite bothersome for him.  We discussed the nature of trigger fingers as well as her diagnosis, prognosis, both conservative and surgical treatment options.  Follow-Up Instructions: No follow-ups on file.   Orders:  No orders of the defined types were placed in this encounter.  No orders of the defined types were placed in this encounter.     Procedures: Hand/UE Inj: R thumb A1 for trigger finger on 09/28/2021 2:37 PM Indications: tendon swelling and therapeutic Details: volar approach Medications: 1 mL lidocaine 1 %; 6 mg betamethasone acetate-betamethasone sodium phosphate 6 (3-3) MG/ML Procedure, treatment alternatives, risks and benefits explained, specific risks discussed. Consent was given by the patient. Immediately prior to procedure a time out was called to verify the correct patient, procedure, equipment, support staff and site/side marked as required. Patient was prepped and draped in the usual sterile fashion.       Clinical Data: No additional findings.   Subjective: Chief Complaint  Patient presents with   Left Thumb - Pain   Right Thumb - Pain    This is an 81 year old male who presents with symptomatic triggering of the right thumb.  Is been going on for about a month and a half or so.  It is progressively worsened since it was first noticed.  He has occasional pain over the A1 pulley  He does have some mild pain over the left thumb A1 pulley but no triggering and this is much less bothersome for him.    Review of Systems   Objective: Vital Signs: There  were no vitals taken for this visit.  Physical Exam  Right Hand Exam   Tenderness  The patient is experiencing no tenderness.   Other  Erythema: absent Sensation: normal Pulse: present  Comments:  Palpable and visible triggering of the left thumb.       Specialty Comments:  No specialty comments available.  Imaging: No results found.   PMFS History: Patient Active Problem List   Diagnosis Date Noted   Trigger thumb, right thumb 09/28/2021   Dementia due to Alzheimer's disease (HCC) 08/24/2021   Vitreous membranes and strands 08/04/2021   Posterior vitreous detachment of left eye 08/04/2021   H/O rheumatic heart disease 05/04/2021   Fracture of radial head, right, closed 01/11/2021   Right bundle branch block 10/01/2018   Essential hypertension 10/01/2018   Pure hypercholesterolemia 10/01/2018   Past Medical History:  Diagnosis Date   Allergy    Anxiety disorder    BBB (bundle branch block)    Benign prostatic hyperplasia    Cancer (HCC)    skin - on nose and temple   Cataract    just watching - bilateral   Gastroesophageal reflux disease    HA (headache)    otc med prn   Hyperlipidemia    Hypertensive disorder    Inguinal hernia    repaired by surgery   Insomnia  Kidney stones    passed stone - no surgery required   Major depressive disorder    Osteoarthritis    OF THE HIP and mid back    Family History  Problem Relation Age of Onset   Colon polyps Neg Hx    Rectal cancer Neg Hx    Stomach cancer Neg Hx     Past Surgical History:  Procedure Laterality Date   COLONOSCOPY W/ POLYPECTOMY  09/2014   polyps  - [place] in [place]   COLONOSCOPY W/ POLYPECTOMY     UNCODED SURGICAL HISTORY   HERNIA REPAIR     INGUINAL HERNIA REPAIR     MOHS SURGERY  2018   x x  on nose   MOHS SURGERY     MOHS' CHEMOSURGERY PROCEDURE DATE: 09/30/2010:BCC R INFERIOR TEMPLE, UNCODED SURGICAL HISTORY   TONSILLECTOMY     WISDOM TOOTH EXTRACTION     Social History    Occupational History   Not on file  Tobacco Use   Smoking status: Never    Passive exposure: Never   Smokeless tobacco: Never  Vaping Use   Vaping Use: Never used  Substance and Sexual Activity   Alcohol use: Never   Drug use: No   Sexual activity: Not on file

## 2021-10-19 ENCOUNTER — Encounter: Payer: Self-pay | Admitting: Physician Assistant

## 2021-10-20 ENCOUNTER — Other Ambulatory Visit: Payer: Self-pay | Admitting: Physician Assistant

## 2021-10-20 MED ORDER — MEMANTINE HCL 10 MG PO TABS: 10.0000 mg | ORAL_TABLET | Freq: Two times a day (BID) | ORAL | 4 refills | Status: DC

## 2021-10-26 ENCOUNTER — Ambulatory Visit (INDEPENDENT_AMBULATORY_CARE_PROVIDER_SITE_OTHER): Payer: Medicare HMO

## 2021-10-26 ENCOUNTER — Ambulatory Visit: Payer: Medicare HMO | Admitting: Orthopedic Surgery

## 2021-10-26 DIAGNOSIS — M25521 Pain in right elbow: Secondary | ICD-10-CM | POA: Diagnosis not present

## 2021-10-26 DIAGNOSIS — S52124A Nondisplaced fracture of head of right radius, initial encounter for closed fracture: Secondary | ICD-10-CM | POA: Diagnosis not present

## 2021-10-26 NOTE — Progress Notes (Signed)
Office Visit Note   Patient: Peter Best           Date of Birth: 11/02/1940           MRN: [id number] Visit Date: 10/26/2021              Requested by: [name], MD [address] [place],  [place] [address] PCP: [name], MD   Assessment & Plan: Visit Diagnoses:  1. Pain in right elbow   2. Closed nondisplaced fracture of head of right radius, initial encounter     Plan: Patient describes dull, aching soreness at the proximal lateral forearm and around the elbow.  This seems to be more of a minor annoyance for him and a significant issue.  We discussed varying treatment options including physical therapy versus an oral and topical anti-inflammatory medication.  He is not interested in any treatment at this point given the mild nature of the discomfort.  Reviewed his x-rays which show some degenerative changes and the known radial head fracture.  There are no fragments within the joint and he denies any mechanical symptoms.  I can see him back again as needed.  Follow-Up Instructions: No follow-ups on file.   Orders:  Orders Placed This Encounter  Procedures   XR Elbow 2 Views Right   No orders of the defined types were placed in this encounter.     Procedures: No procedures performed   Clinical Data: No additional findings.   Subjective: Chief Complaint  Patient presents with   Right Elbow - Pain   Right Forearm - Pain    This is an 81 yo M who has a history of a right radial head fracture after a GLF in October who presents with mild, aching soreness in the lateral elbow and proximal forearm.  He has no pain w/ ROM of th elbow today and denies any mechanical symptoms.  The pain will occasionally bother him at night but resolved with repositioning.  He has occasionally taken an ibuprofen as need for this issue.      Review of Systems   Objective: Vital Signs: There were no vitals taken for this visit.  Physical Exam  Right  Elbow Exam   Tenderness  The patient is experiencing no tenderness.   Range of Motion  Right elbow extension: Extension to approx 30 deg.  Flexion:  normal  Pronation:  normal  Supination:  normal   Other  Erythema: absent Sensation: normal Pulse: present  Comments:  No mechanical symptoms w/ flexion/extension/pronation/supination.  No swelling.  No TTP.       Specialty Comments:  No specialty comments available.  Imaging: No results found.   PMFS History: Patient Active Problem List   Diagnosis Date Noted   Trigger thumb, right thumb 09/28/2021   Dementia due to Alzheimer's disease (HCC) 08/24/2021   Vitreous membranes and strands 08/04/2021   Posterior vitreous detachment of left eye 08/04/2021   H/O rheumatic heart disease 05/04/2021   Fracture of radial head, right, closed 01/11/2021   Right bundle branch block 10/01/2018   Essential hypertension 10/01/2018   Pure hypercholesterolemia 10/01/2018   Past Medical History:  Diagnosis Date   Allergy    Anxiety disorder    BBB (bundle branch block)    Benign prostatic hyperplasia    Cancer (HCC)    skin - on nose and temple   Cataract    just watching - bilateral   Gastroesophageal reflux disease  HA (headache)    otc med prn   Hyperlipidemia    Hypertensive disorder    Inguinal hernia    repaired by surgery   Insomnia    Kidney stones    passed stone - no surgery required   Major depressive disorder    Osteoarthritis    OF THE HIP and mid back    Family History  Problem Relation Age of Onset   Colon polyps Neg Hx    Rectal cancer Neg Hx    Stomach cancer Neg Hx     Past Surgical History:  Procedure Laterality Date   COLONOSCOPY W/ POLYPECTOMY  09/2014   polyps  - [place]   COLONOSCOPY W/ POLYPECTOMY     UNCODED SURGICAL HISTORY   HERNIA REPAIR     INGUINAL HERNIA REPAIR     MOHS SURGERY  2018   x x  on nose   MOHS SURGERY     MOHS' CHEMOSURGERY PROCEDURE DATE: 09/30/2010:BCC R  INFERIOR TEMPLE, UNCODED SURGICAL HISTORY   TONSILLECTOMY     WISDOM TOOTH EXTRACTION     Social History   Occupational History   Not on file  Tobacco Use   Smoking status: Never    Passive exposure: Never   Smokeless tobacco: Never  Vaping Use   Vaping Use: Never used  Substance and Sexual Activity   Alcohol use: Never   Drug use: No   Sexual activity: Not on file

## 2021-10-28 ENCOUNTER — Encounter (HOSPITAL_COMMUNITY): Payer: Self-pay

## 2021-10-28 ENCOUNTER — Emergency Department (HOSPITAL_COMMUNITY): Payer: Medicare HMO

## 2021-10-28 ENCOUNTER — Emergency Department (HOSPITAL_COMMUNITY)
Admission: EM | Admit: 2021-10-28 | Discharge: 2021-10-28 | Disposition: A | Discharge status: Home / Self Care | Payer: Medicare HMO | Attending: Emergency Medicine | Admitting: Emergency Medicine

## 2021-10-28 ENCOUNTER — Encounter: Payer: Self-pay | Admitting: Physician Assistant

## 2021-10-28 ENCOUNTER — Other Ambulatory Visit: Payer: Self-pay

## 2021-10-28 DIAGNOSIS — S0031XA Abrasion of nose, initial encounter: Secondary | ICD-10-CM | POA: Diagnosis not present

## 2021-10-28 DIAGNOSIS — W232XXA Caught, crushed, jammed or pinched between a moving and stationary object, initial encounter: Secondary | ICD-10-CM | POA: Insufficient documentation

## 2021-10-28 DIAGNOSIS — I10 Essential (primary) hypertension: Secondary | ICD-10-CM | POA: Diagnosis not present

## 2021-10-28 DIAGNOSIS — G309 Alzheimer's disease, unspecified: Secondary | ICD-10-CM | POA: Insufficient documentation

## 2021-10-28 DIAGNOSIS — G939 Disorder of brain, unspecified: Secondary | ICD-10-CM | POA: Diagnosis not present

## 2021-10-28 DIAGNOSIS — W19XXXA Unspecified fall, initial encounter: Secondary | ICD-10-CM

## 2021-10-28 DIAGNOSIS — S0990XA Unspecified injury of head, initial encounter: Secondary | ICD-10-CM | POA: Diagnosis not present

## 2021-10-28 DIAGNOSIS — S0003XA Contusion of scalp, initial encounter: Secondary | ICD-10-CM | POA: Diagnosis not present

## 2021-10-28 DIAGNOSIS — D179 Benign lipomatous neoplasm, unspecified: Secondary | ICD-10-CM | POA: Diagnosis not present

## 2021-10-28 DIAGNOSIS — S0093XA Contusion of unspecified part of head, initial encounter: Secondary | ICD-10-CM | POA: Insufficient documentation

## 2021-10-28 DIAGNOSIS — Y9301 Activity, walking, marching and hiking: Secondary | ICD-10-CM | POA: Diagnosis not present

## 2021-10-28 DIAGNOSIS — M47812 Spondylosis without myelopathy or radiculopathy, cervical region: Secondary | ICD-10-CM | POA: Diagnosis not present

## 2021-10-28 DIAGNOSIS — Z743 Need for continuous supervision: Secondary | ICD-10-CM | POA: Diagnosis not present

## 2021-10-28 DIAGNOSIS — T148XXA Other injury of unspecified body region, initial encounter: Secondary | ICD-10-CM

## 2021-10-28 DIAGNOSIS — G319 Degenerative disease of nervous system, unspecified: Secondary | ICD-10-CM | POA: Diagnosis not present

## 2021-10-28 DIAGNOSIS — M25521 Pain in right elbow: Secondary | ICD-10-CM | POA: Diagnosis not present

## 2021-10-28 MED ORDER — BACITRACIN ZINC 500 UNIT/GM EX OINT
TOPICAL_OINTMENT | Freq: Two times a day (BID) | CUTANEOUS | Status: DC
  Administered 2021-10-28: 1 via TOPICAL
  Filled 2021-10-28: qty 0.9

## 2021-10-28 NOTE — ED Provider Notes (Signed)
[facility] DEPT Provider Note   CSN: [id number] Arrival date & time: 10/28/21  1146     History  Chief Complaint  Patient presents with   Head Injury    Peter Best is a 81 y.o. male.   Head Injury   81 year old male presents emergency department with complaints of head injury.  Patient states that he was walking on the sidewalk earlier today when his foot got caught in a crack between 2 slabs of concrete causing him to fall landing on his right arm as well as his right forehead.  Patient denies blood thinner use, loss of consciousness, presyncopal type symptoms.  He was [facility] and was transported via EMS to the emergency department.  He is currently not complaining of dizziness, lightheadedness, blurred vision, facial droop, slurred speech, weakness/sensory deficits, nausea, vomiting.  Past medical history significant for hyperlipidemia, major depressive disorder, headache, GERD, dementia due to Alzheimer's,  Home Medications Prior to Admission medications   Medication Sig Start Date End Date Taking? Authorizing Provider  acetaminophen (TYLENOL) 500 MG tablet Take 1,000 mg by mouth every 6 (six) hours as needed for mild pain.    [provider]  atorvastatin (LIPITOR) 20 MG tablet Take 20 mg by mouth daily. 05/06/19   [provider]  ibuprofen (ADVIL,MOTRIN) 400 MG tablet Take 400 mg by mouth every 6 (six) hours as needed for mild pain.    [provider]  lisinopril (ZESTRIL) 20 MG tablet Take 20 mg by mouth daily. 03/31/21   [provider]  memantine (NAMENDA) 10 MG tablet Take 1 tablet (10 mg total) by mouth 2 (two) times daily. 10/20/21   Rondel Jumbo, PA-C  omeprazole (PRILOSEC) 20 MG capsule Take 20 mg by mouth daily.    [provider]  sertraline (ZOLOFT) 100 MG tablet Take 1 tablet by mouth as needed.    [provider]  traZODone (DESYREL) 50 MG  tablet Take 1 tablet by mouth as needed.    [provider]      Allergies    Patient has no known allergies.    Review of Systems   Review of Systems  All other systems reviewed and are negative.   Physical Exam Updated Vital Signs BP 139/70   Pulse 76   Temp 98.3 F (36.8 C)   Resp 18   Ht '5\' 11"'$  (1.803 m)   Wt 73.5 kg   SpO2 98%   BMI 22.59 kg/m  Physical Exam Vitals and nursing note reviewed.  Constitutional:      General: He is not in acute distress.    Appearance: Normal appearance. He is well-developed.  HENT:     Head: Normocephalic.     Comments: Patient has swelling noted just superior to right orbit with underlying hematoma noted.  Superficial abrasions noted in area with no obvious laceration or ulceration.  Patient has similarly area of abrasion on bridge of nose.  No tenderness to palpation of nasal bones the patient has tenderness over said abrasion.  Dried blood noted at the base of each nare with no active bleeding.  No evidence of nasal hematoma    Right Ear: Tympanic membrane normal.     Left Ear: Tympanic membrane normal.     Mouth/Throat:     Mouth: Mucous membranes are moist.     Pharynx: Oropharynx is clear. No posterior oropharyngeal erythema.  Eyes:     General: No visual field deficit.  Extraocular Movements: Extraocular movements intact.     Conjunctiva/sclera: Conjunctivae normal.     Pupils: Pupils are equal, round, and reactive to light.  Cardiovascular:     Rate and Rhythm: Normal rate and regular rhythm.     Heart sounds: No murmur heard. Pulmonary:     Effort: Pulmonary effort is normal. No respiratory distress.     Breath sounds: Normal breath sounds.  Abdominal:     Palpations: Abdomen is soft.     Tenderness: There is no abdominal tenderness. There is no right CVA tenderness or left CVA tenderness.  Musculoskeletal:        General: No swelling.     Cervical back: Neck supple.     Right lower leg: No edema.     Left  lower leg: No edema.     Comments: Patient no tenderness to palpation of cervical, thoracic, lumbar spine with no evidence of step-off or deformity.  No tenderness to palpation along upper and lower extremities besides right elbow.  Patient has full range of motion of affected extremities.  Muscle strength 5 out of 5 bilaterally.  No sensory deficits along major nerve distributions of upper or lower extremities.  Radial and posterior tibial pulses full and intact bilaterally.  Skin:    General: Skin is warm and dry.     Capillary Refill: Capillary refill takes less than 2 seconds.  Neurological:     General: No focal deficit present.     Mental Status: He is alert and oriented to person, place, and time.     GCS: GCS eye subscore is 4. GCS verbal subscore is 5. GCS motor subscore is 6.     Cranial Nerves: No dysarthria or facial asymmetry.     Motor: No weakness.     Coordination: Romberg sign negative. Coordination normal. Finger-Nose-Finger Test and Heel to [facility] Test normal.     Gait: Gait is intact. Gait normal.     Comments: Cranial nerve III through XII grossly intact.  PERRLA bilaterally.  EOMs intact bilaterally.  Psychiatric:        Mood and Affect: Mood normal.     ED Results / Procedures / Treatments   Labs (all labs ordered are listed, but only abnormal results are displayed) Labs Reviewed - No data to display  EKG None  Radiology No results found.  Procedures Procedures    Medications Ordered in ED Medications - No data to display   ED Course/ Medical Decision Making/ A&P                           Medical Decision Making Amount and/or Complexity of Data Reviewed Radiology: ordered.  Risk OTC drugs.   This patient presents to the ED for concern of fall, this involves an extensive number of treatment options, and is a complaint that carries with it a high risk of complications and morbidity.  The differential diagnosis includes near syncope, syncope,  intracranial hemorrhage, skull fracture, vertebral fracture, spinal cord transection   Co morbidities that complicate the patient evaluation  See HPI   Additional history obtained:  Additional history obtained from EMR External records from outside source obtained and reviewed including CT head from 01/05/2021   Lab Tests:  N/a   Imaging Studies ordered:  I ordered imaging studies including CT head and CT C-spine and x-ray of right elbow I independently visualized and interpreted imaging which showed  CT head/C-spine: No fracture.  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  Mild spinal stenosis is seen at C5-C6.  No signs of intracranial bleeding.  Central and cortical atrophy.  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  Large subcutaneous hematoma in the right frontal scalp.  No fracture seen in calvarium. X-ray right elbow: Right radial head deformities noted secondary to old fracture.  Mild abnormal fat pad displacement but suggested possible joint effusion.  No definite acute fracture or dislocation is noted. I agree with the radiologist interpretation  Cardiac Monitoring: / EKG:  The patient was maintained on a cardiac monitor.  I personally viewed and interpreted the cardiac monitored which showed an underlying rhythm of: Sinus rhythm   Consultations Obtained:  N/a   Problem List / ED Course / Critical interventions / Medication management  Fall I ordered medication including bacitracin ointment for abrasions   Reevaluation of the patient after these medicines showed that the patient improved I have reviewed the patients home medicines and have made adjustments as needed   Social Determinants of Health:  Baseline dementia with unstable gait.  Denies tobacco or illicit drug use.   Test / Admission - Considered:  Fall Vitals signs within normal range and stable  throughout visit. Imaging studies significant for: See above.  Laboratory studies considered but deemed not necessary due to lack of presyncopal/syncopal elements of patient's history.  Fall was believed to be mechanical in nature. Given lack of concerning acute fracture or intracranial process with only positive acute finding of subcutaneous hematoma, further work-up deemed unnecessary at this time.  Abrasions noted on patient's nasal bridge and right forehead were prepped in sterile fashion, antibiotic ointment was applied with sterile dressing placed.  Posterior long-arm splint most placed on patient's right arm given the findings on right elbow radiograph concerning for radial head fracture.  Follow-up was recommended with orthopedics as soon as he is able to make an appointment for reevaluation.  Ice/heat, NSAIDs recommended for hematoma noted on patient's right forehead as well as continued antibiotic ointment and dressing changes on noted abrasions.  Treatment plan was discussed at length with patient and he acknowledged understanding was agreeable to said plan. Worrisome signs and symptoms were discussed with the patient, and the patient acknowledged understanding to return to the ED if noticed. Patient was stable upon discharge.         Final Clinical Impression(s) / ED Diagnoses Final diagnoses:  Injury of head, initial encounter  Fall, initial encounter  Hematoma    Rx / DC Orders ED Discharge Orders     None         Wilnette Kales, PA 11/03/21 3235    Milton Ferguson, MD 11/04/21 1754

## 2021-10-28 NOTE — ED Notes (Signed)
Patient transported to CT 

## 2021-10-28 NOTE — Discharge Instructions (Addendum)
Note your work-up today was overall negative for fracture or brain bleed.  You have superficial skin abrasions noted on your right forehead as well as the bridge of your nose.  We will treat this with antibiotic ointment as well as daily changes in bandages.  You can take ibuprofen as needed for pain/inflammation.  Heat helps absorb hematomas, but you can use ice in the next 2 to 3 days to help decrease the swelling from the fall.  There were findings today on your x-ray indicative of a right radial head fracture which is near the elbow.  We have placed you in a splint while in the emergency department.  I recommend follow-up with orthopedics as soon as you are able with an appointment.  Typically, we keep these splints on for 4 to 6 weeks.  Evaluate to make sure proper healing occurs.  Please do not hesitate to return to the emergency department if the worrisome signs and symptoms we discussed become apparent.

## 2021-10-28 NOTE — Progress Notes (Signed)
Orthopedic Tech Progress Note Patient Details:  Peter Best 06-Jun-1940 [id number]  Ortho Devices Type of Ortho Device: Post (long arm) splint, Sling arm elevator, Cotton web roll, Ace wrap Ortho Device/Splint Location: RUE Ortho Device/Splint Interventions: Ordered, Application   Post Interventions Patient Tolerated: Well Instructions Provided: Care of device  [name] 10/28/2021, 1:48 PM

## 2021-10-28 NOTE — ED Triage Notes (Signed)
Pt arrives via EMS. Pt was out for a walk and tripped over the sidewalk. Pt did strike his head, no loc, no blood thinners. Golf ball sized hematoma noted to right forehead. Pt also c/o pain in bilateral elbows. Pt AxOx4

## 2021-10-28 NOTE — ED Provider Triage Note (Signed)
Emergency Medicine Provider Triage Evaluation Note  Peter Best , a 81 y.o. male  was evaluated in triage.  Pt complains of mechanical fall on the sidewalk. Tried stepping up on sidewalk and tripped. Struck his head. Not on chronic anticoagulation.   Review of Systems  Positive: As above Negative: Syncope, dizziness  Physical Exam  BP (!) 154/92 (BP Location: Left Arm)   Pulse 82   Temp 98.5 F (36.9 C) (Oral)   Resp 18   Ht '5\' 11"'$  (1.803 m)   Wt 73.5 kg   SpO2 97%   BMI 22.59 kg/m  Gen:   Awake, no distress   Resp:  Normal effort  MSK:   Moves extremities without difficulty  Other:  Hematoma noted to the right forehead, small laceration on left elbow, small abrasion on right elbow  Medical Decision Making  Medically screening exam initiated at 11:58 AM.  Appropriate orders placed.  Andry Bogden was informed that the remainder of the evaluation will be completed by another provider, this initial triage assessment does not replace that evaluation, and the importance of remaining in the ED until their evaluation is complete.     Jenayah Antu T, PA-C 10/28/21 1158

## 2021-10-29 ENCOUNTER — Ambulatory Visit: Payer: Medicare HMO | Admitting: Orthopedic Surgery

## 2021-10-29 ENCOUNTER — Encounter: Payer: Self-pay | Admitting: Orthopedic Surgery

## 2021-10-29 DIAGNOSIS — S52124A Nondisplaced fracture of head of right radius, initial encounter for closed fracture: Secondary | ICD-10-CM

## 2021-10-29 NOTE — Telephone Encounter (Signed)
Glad patient is ok. He can continue with the Neurocognitive test as scheduled. Thanks  Will also refill med

## 2021-10-29 NOTE — Progress Notes (Signed)
Office Visit Note   Patient: Peter Best           Date of Birth: Jul 31, 1940           MRN: [id number] Visit Date: 10/29/2021              Requested by: [name], MD [address] [place],  [place] [address] PCP: [name], MD   Assessment & Plan: Visit Diagnoses:  1. Closed nondisplaced fracture of head of right radius, initial encounter     Plan: Patient was last seen earlier this week but unfortunate had a ground-level fall on Wednesday.  He landed face forward and has significant periorbital and forehead ecchymosis and swelling.  X-rays of the right elbow were taken in the ER which demonstrate largely unchanged old radial head fracture.  He has no pain in the elbow.  He thinks most of his discomfort is from wearing the long-arm splint.  Splint was removed.  He has full and painless range of motion of the elbow with no mechanical block to motion.  Discussed treating this with an oral anti-inflammatory medication and rest.  He can see me back again if his elbow continues to bother him.  Follow-Up Instructions: No follow-ups on file.   Orders:  No orders of the defined types were placed in this encounter.  No orders of the defined types were placed in this encounter.     Procedures: No procedures performed   Clinical Data: No additional findings.   Subjective: Chief Complaint  Patient presents with   Right Elbow - Injury    DOI 10/28/2021    This is an 81 year old male who has a history of a previous right radial head fracture, fall October and was last seen earlier this week who presents following a ground-level fall on Wednesday.  He lost his balance and fell forward on concrete.  He was seen in the ER where he was found to have significant facial ecchymosis and swelling around the orbits and at the forehead.  X-rays were obtained that time which demonstrated a largely unchanged appearing old radial head fracture.  He has no pain in his elbow  today.  He was placed in a long-arm splint.  This was removed.  He has full and painless range of motion of the elbow.  He does note that he has a noticeable leg length discrepancy with the right leg slightly shorter.  He typically wears shoe lifts with not wearing one when this event occurred.  He has a therapy prescription with him to work on gait training, balance, and overall conditioning.  Injury    Review of Systems   Objective: Vital Signs: There were no vitals taken for this visit.  Physical Exam  Right Elbow Exam   Tenderness  The patient is experiencing no tenderness.   Range of Motion  Right elbow extension: Lacks approx 20 deg extension.  Flexion:  normal  Pronation:  normal  Supination:  normal   Other  Erythema: absent Sensation: normal Pulse: present      Specialty Comments:  No specialty comments available.  Imaging: No results found.   PMFS History: Patient Active Problem List   Diagnosis Date Noted   Trigger thumb, right thumb 09/28/2021   Dementia due to Alzheimer's disease (HCC) 08/24/2021   Vitreous membranes and strands 08/04/2021   Posterior vitreous detachment of left eye 08/04/2021   H/O rheumatic heart disease 05/04/2021   Fracture of radial head, right, closed  01/11/2021   Right bundle branch block 10/01/2018   Essential hypertension 10/01/2018   Pure hypercholesterolemia 10/01/2018   Past Medical History:  Diagnosis Date   Allergy    Anxiety disorder    BBB (bundle branch block)    Benign prostatic hyperplasia    Cancer (HCC)    skin - on nose and temple   Cataract    just watching - bilateral   Gastroesophageal reflux disease    HA (headache)    otc med prn   Hyperlipidemia    Hypertensive disorder    Inguinal hernia    repaired by surgery   Insomnia    Kidney stones    passed stone - no surgery required   Major depressive disorder    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   COLONOSCOPY W/ POLYPECTOMY     UNCODED SURGICAL HISTORY   HERNIA REPAIR     INGUINAL HERNIA REPAIR     MOHS SURGERY  2018   x x  on nose   MOHS SURGERY     MOHS' CHEMOSURGERY PROCEDURE DATE: 09/30/2010:BCC R INFERIOR TEMPLE, UNCODED SURGICAL HISTORY   TONSILLECTOMY     WISDOM TOOTH EXTRACTION     Social History   Occupational History   Not on file  Tobacco Use   Smoking status: Never    Passive exposure: Never   Smokeless tobacco: Never  Vaping Use   Vaping Use: Never used  Substance and Sexual Activity   Alcohol use: Never   Drug use: No   Sexual activity: Not on file

## 2021-11-02 DIAGNOSIS — R41841 Cognitive communication deficit: Secondary | ICD-10-CM | POA: Diagnosis not present

## 2021-11-03 DIAGNOSIS — R2689 Other abnormalities of gait and mobility: Secondary | ICD-10-CM | POA: Diagnosis not present

## 2021-11-04 ENCOUNTER — Encounter: Payer: Self-pay | Admitting: Psychology

## 2021-11-04 ENCOUNTER — Ambulatory Visit: Payer: Medicare HMO

## 2021-11-04 ENCOUNTER — Ambulatory Visit: Payer: Medicare HMO | Admitting: Psychology

## 2021-11-04 DIAGNOSIS — G3184 Mild cognitive impairment, so stated: Secondary | ICD-10-CM | POA: Diagnosis not present

## 2021-11-04 DIAGNOSIS — N401 Enlarged prostate with lower urinary tract symptoms: Secondary | ICD-10-CM | POA: Insufficient documentation

## 2021-11-04 DIAGNOSIS — K2100 Gastro-esophageal reflux disease with esophagitis, without bleeding: Secondary | ICD-10-CM | POA: Insufficient documentation

## 2021-11-04 DIAGNOSIS — R4189 Other symptoms and signs involving cognitive functions and awareness: Secondary | ICD-10-CM

## 2021-11-04 DIAGNOSIS — F418 Other specified anxiety disorders: Secondary | ICD-10-CM | POA: Insufficient documentation

## 2021-11-04 DIAGNOSIS — R2689 Other abnormalities of gait and mobility: Secondary | ICD-10-CM | POA: Diagnosis not present

## 2021-11-04 DIAGNOSIS — F325 Major depressive disorder, single episode, in full remission: Secondary | ICD-10-CM | POA: Insufficient documentation

## 2021-11-04 DIAGNOSIS — R4789 Other speech disturbances: Secondary | ICD-10-CM

## 2021-11-04 DIAGNOSIS — R41841 Cognitive communication deficit: Secondary | ICD-10-CM | POA: Diagnosis not present

## 2021-11-04 DIAGNOSIS — K5730 Diverticulosis of large intestine without perforation or abscess without bleeding: Secondary | ICD-10-CM | POA: Insufficient documentation

## 2021-11-04 NOTE — Progress Notes (Signed)
   Psychometrician Note   Cognitive testing was administered to Peter Best by Cruzita Lederer, B.S. (psychometrist) under the supervision of Dr. Christia Reading, Ph.D., licensed psychologist on 11/04/2021. Mr. Sweet did not appear overtly distressed by the testing session per behavioral observation or responses across self-report questionnaires. Rest breaks were offered.    The battery of tests administered was selected by Dr. Christia Reading, Ph.D. with consideration to Mr. Candelaria's current level of functioning, the nature of his symptoms, emotional and behavioral responses during interview, level of literacy, observed level of motivation/effort, and the nature of the referral question. This battery was communicated to the psychometrist. Communication between Dr. Christia Reading, Ph.D. and the psychometrist was ongoing throughout the evaluation and Dr. Christia Reading, Ph.D. was immediately accessible at all times. Dr. Christia Reading, Ph.D. provided supervision to the psychometrist on the date of this service to the extent necessary to assure the quality of all services provided.    Oaklee Sunga will return within approximately 1-2 weeks for an interactive feedback session with Dr. Melvyn Novas at which time his test performances, clinical impressions, and treatment recommendations will be reviewed in detail. Mr. Bhardwaj understands he can contact our office should he require our assistance before this time.  A total of 130 minutes of billable time were spent face-to-face with Mr. Gangemi by the psychometrist. This includes both test administration and scoring time. Billing for these services is reflected in the clinical report generated by Dr. Christia Reading, Ph.D.  This note reflects time spent with the psychometrician and does not include test scores or any clinical interpretations made by Dr. Melvyn Novas. The full report will follow in a separate note.

## 2021-11-04 NOTE — Progress Notes (Addendum)
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 58 79 Above Average    Naming 27/31 (41) 18 Below Average       Visuospatial/Visuoconstruction:      Raw Score Percentile   Clock Drawing: 6/10 --- Below Expectation        Scaled Score Percentile   WAIS-IV Block Design: 8 25 Average       Mood and Personality:      Raw Score Percentile   Geriatric Depression Scale: 8 --- Within Normal Limits  Geriatric Anxiety Scale: 10 --- Minimal    Somatic 3 --- Minimal    Cognitive 4 --- Mild    Affective 3 --- Minimal       Additional Questionnaires:      Raw Score Percentile   PROMIS Sleep Disturbance Questionnaire: 13 --- None to Slight   Informed Consent and Coding/Compliance:   The current evaluation represents a clinical evaluation for the purposes previously outlined by the referral source and is in no way reflective of a forensic evaluation.   Mr. Zaman was provided with a verbal description of the nature and purpose of the present neuropsychological evaluation. Also reviewed  were the foreseeable risks and/or discomforts and benefits of the procedure, limits of confidentiality, and mandatory reporting requirements of this provider. The patient was given the opportunity to ask questions and receive answers about the evaluation. Oral consent to participate was provided by the patient.   This evaluation was conducted by Christia Reading, Ph.D., ABPP-CN, board certified clinical neuropsychologist.  Mr. Copes completed a clinical interview with Dr. Melvyn Novas, billed as one unit [phone], and 130 minutes of cognitive testing and scoring, billed as one unit [phone] and three additional units [id number]. Psychometrist Cruzita Lederer, B.S., assisted Dr. Melvyn Novas with test administration and scoring procedures. As a separate and discrete service, Dr. Melvyn Novas spent a total of 160 minutes in interpretation and report writing billed as one unit [phone] and two units [id number].

## 2021-11-05 DIAGNOSIS — R2689 Other abnormalities of gait and mobility: Secondary | ICD-10-CM | POA: Diagnosis not present

## 2021-11-05 DIAGNOSIS — M6281 Muscle weakness (generalized): Secondary | ICD-10-CM | POA: Diagnosis not present

## 2021-11-05 DIAGNOSIS — R296 Repeated falls: Secondary | ICD-10-CM | POA: Diagnosis not present

## 2021-11-05 DIAGNOSIS — R278 Other lack of coordination: Secondary | ICD-10-CM | POA: Diagnosis not present

## 2021-11-08 DIAGNOSIS — M6281 Muscle weakness (generalized): Secondary | ICD-10-CM | POA: Diagnosis not present

## 2021-11-08 DIAGNOSIS — R296 Repeated falls: Secondary | ICD-10-CM | POA: Diagnosis not present

## 2021-11-08 DIAGNOSIS — R41841 Cognitive communication deficit: Secondary | ICD-10-CM | POA: Diagnosis not present

## 2021-11-08 DIAGNOSIS — R278 Other lack of coordination: Secondary | ICD-10-CM | POA: Diagnosis not present

## 2021-11-09 DIAGNOSIS — R2689 Other abnormalities of gait and mobility: Secondary | ICD-10-CM | POA: Diagnosis not present

## 2021-11-10 DIAGNOSIS — R296 Repeated falls: Secondary | ICD-10-CM | POA: Diagnosis not present

## 2021-11-10 DIAGNOSIS — M6281 Muscle weakness (generalized): Secondary | ICD-10-CM | POA: Diagnosis not present

## 2021-11-10 DIAGNOSIS — R278 Other lack of coordination: Secondary | ICD-10-CM | POA: Diagnosis not present

## 2021-11-11 ENCOUNTER — Ambulatory Visit: Payer: Medicare HMO | Admitting: Psychology

## 2021-11-11 DIAGNOSIS — R2689 Other abnormalities of gait and mobility: Secondary | ICD-10-CM | POA: Diagnosis not present

## 2021-11-11 DIAGNOSIS — G3184 Mild cognitive impairment, so stated: Secondary | ICD-10-CM

## 2021-11-11 NOTE — Progress Notes (Signed)
   Neuropsychology Feedback Session Peter Best. [facility]  Reason for Referral:   Peter Best is a 80 y.o. right-handed Caucasian male referred by  Peter Butters, PA-C , to characterize his current cognitive functioning and assist with diagnostic clarity and treatment planning in the context of subjective cognitive decline.   Feedback:   Peter Best completed a comprehensive neuropsychological evaluation on 11/04/2021. Please refer to that encounter for the full report and recommendations. Briefly, results suggested fairly isolated impairments surrounding cognitive flexibility and delayed retrieval of a previously learned list of words. A relative weakness was noted across expressive language in general, especially semantic fluency. Variability was noted across visuospatial abilities. The etiology for ongoing dysfunction is unclear at the present time. During interview, Peter Best described his most salient concerns surrounding word finding and expressive language. Broadly, this domain did exhibit the most consistent weakness across testing. When considering neurodegenerative illnesses, a primary progressive aphasia (PPA) presentation could be considered as initial language dysfunction is the expectation within this illness. While Peter Best does not display characteristics of the non-fluent and semantic subtypes, patterns across testing could align with the logopenic subtype. Namely, weaknesses surrounding sentence repetition, single word retrieval (list learning task), verbal fluency, and confrontation naming, would align with very early presentations of this illness. His age would be somewhat advanced relative to a typical age of onset for this illness. However, I feel this should remain under consideration as a plausible explanation for day-to-day decline and objective weakness. Regarding concerns for Alzheimer's disease, Peter Best does not exhibit a pattern  typical of this illness at the present time. While he was fully amnestic (i.e., 0% retention) across a list learning task, he performed well across story and figure memory tasks, demonstrating intact memory storage and retrieval abilities. It is worth highlighting that logopenic PPA presentations most often share pathological changes with Alzheimer's disease, making the outward expression of these illnesses quite similar in many cases. Continued medical monitoring will be important moving forward.   Peter Best was unaccompanied during the current telephone call. He was within his residence while I was within my office. I discussed the limitations of evaluation and management by telemedicine and the availability of in person appointments. Peter Best expressed his understanding and agreed to proceed. Content of the current session focused on the results of his neuropsychological evaluation. Peter Best was given the opportunity to ask questions and his questions were answered. He was encouraged to reach out should additional questions arise. His report is available to him to review on MyChart.      22 minutes were spent conducting the current feedback session with Peter Best, billed as one unit [phone].

## 2021-11-12 DIAGNOSIS — R41841 Cognitive communication deficit: Secondary | ICD-10-CM | POA: Diagnosis not present

## 2021-11-16 DIAGNOSIS — R2689 Other abnormalities of gait and mobility: Secondary | ICD-10-CM | POA: Diagnosis not present

## 2021-11-16 DIAGNOSIS — R41841 Cognitive communication deficit: Secondary | ICD-10-CM | POA: Diagnosis not present

## 2021-11-17 DIAGNOSIS — M6281 Muscle weakness (generalized): Secondary | ICD-10-CM | POA: Diagnosis not present

## 2021-11-17 DIAGNOSIS — R296 Repeated falls: Secondary | ICD-10-CM | POA: Diagnosis not present

## 2021-11-17 DIAGNOSIS — R278 Other lack of coordination: Secondary | ICD-10-CM | POA: Diagnosis not present

## 2021-11-18 ENCOUNTER — Ambulatory Visit: Payer: Medicare HMO | Admitting: Physician Assistant

## 2021-11-18 DIAGNOSIS — R41841 Cognitive communication deficit: Secondary | ICD-10-CM | POA: Diagnosis not present

## 2021-11-18 DIAGNOSIS — R2689 Other abnormalities of gait and mobility: Secondary | ICD-10-CM | POA: Diagnosis not present

## 2021-11-19 DIAGNOSIS — R296 Repeated falls: Secondary | ICD-10-CM | POA: Diagnosis not present

## 2021-11-19 DIAGNOSIS — R278 Other lack of coordination: Secondary | ICD-10-CM | POA: Diagnosis not present

## 2021-11-19 DIAGNOSIS — M6281 Muscle weakness (generalized): Secondary | ICD-10-CM | POA: Diagnosis not present

## 2021-11-22 DIAGNOSIS — R2689 Other abnormalities of gait and mobility: Secondary | ICD-10-CM | POA: Diagnosis not present

## 2021-11-23 DIAGNOSIS — R41841 Cognitive communication deficit: Secondary | ICD-10-CM | POA: Diagnosis not present

## 2021-11-24 DIAGNOSIS — M6281 Muscle weakness (generalized): Secondary | ICD-10-CM | POA: Diagnosis not present

## 2021-11-24 DIAGNOSIS — R278 Other lack of coordination: Secondary | ICD-10-CM | POA: Diagnosis not present

## 2021-11-24 DIAGNOSIS — R296 Repeated falls: Secondary | ICD-10-CM | POA: Diagnosis not present

## 2021-11-26 DIAGNOSIS — R2689 Other abnormalities of gait and mobility: Secondary | ICD-10-CM | POA: Diagnosis not present

## 2021-11-30 DIAGNOSIS — R41841 Cognitive communication deficit: Secondary | ICD-10-CM | POA: Diagnosis not present

## 2021-12-01 DIAGNOSIS — R278 Other lack of coordination: Secondary | ICD-10-CM | POA: Diagnosis not present

## 2021-12-01 DIAGNOSIS — R296 Repeated falls: Secondary | ICD-10-CM | POA: Diagnosis not present

## 2021-12-01 DIAGNOSIS — R2689 Other abnormalities of gait and mobility: Secondary | ICD-10-CM | POA: Diagnosis not present

## 2021-12-01 DIAGNOSIS — M6281 Muscle weakness (generalized): Secondary | ICD-10-CM | POA: Diagnosis not present

## 2021-12-03 DIAGNOSIS — S0083XD Contusion of other part of head, subsequent encounter: Secondary | ICD-10-CM | POA: Diagnosis not present

## 2021-12-03 DIAGNOSIS — R2689 Other abnormalities of gait and mobility: Secondary | ICD-10-CM | POA: Diagnosis not present

## 2021-12-06 DIAGNOSIS — R41841 Cognitive communication deficit: Secondary | ICD-10-CM | POA: Diagnosis not present

## 2021-12-07 DIAGNOSIS — R2689 Other abnormalities of gait and mobility: Secondary | ICD-10-CM | POA: Diagnosis not present

## 2021-12-08 DIAGNOSIS — M6281 Muscle weakness (generalized): Secondary | ICD-10-CM | POA: Diagnosis not present

## 2021-12-08 DIAGNOSIS — R296 Repeated falls: Secondary | ICD-10-CM | POA: Diagnosis not present

## 2021-12-08 DIAGNOSIS — R278 Other lack of coordination: Secondary | ICD-10-CM | POA: Diagnosis not present

## 2021-12-09 DIAGNOSIS — R2689 Other abnormalities of gait and mobility: Secondary | ICD-10-CM | POA: Diagnosis not present

## 2021-12-09 DIAGNOSIS — R41841 Cognitive communication deficit: Secondary | ICD-10-CM | POA: Diagnosis not present

## 2021-12-12 DIAGNOSIS — U071 COVID-19: Secondary | ICD-10-CM | POA: Diagnosis not present

## 2021-12-12 DIAGNOSIS — R051 Acute cough: Secondary | ICD-10-CM | POA: Diagnosis not present

## 2021-12-12 DIAGNOSIS — B349 Viral infection, unspecified: Secondary | ICD-10-CM | POA: Diagnosis not present

## 2021-12-12 DIAGNOSIS — J029 Acute pharyngitis, unspecified: Secondary | ICD-10-CM | POA: Diagnosis not present

## 2021-12-12 DIAGNOSIS — R509 Fever, unspecified: Secondary | ICD-10-CM | POA: Diagnosis not present

## 2021-12-21 DIAGNOSIS — R2689 Other abnormalities of gait and mobility: Secondary | ICD-10-CM | POA: Diagnosis not present

## 2021-12-21 DIAGNOSIS — R41841 Cognitive communication deficit: Secondary | ICD-10-CM | POA: Diagnosis not present

## 2021-12-23 DIAGNOSIS — R2689 Other abnormalities of gait and mobility: Secondary | ICD-10-CM | POA: Diagnosis not present

## 2021-12-23 DIAGNOSIS — R41841 Cognitive communication deficit: Secondary | ICD-10-CM | POA: Diagnosis not present

## 2021-12-24 DIAGNOSIS — R2689 Other abnormalities of gait and mobility: Secondary | ICD-10-CM | POA: Diagnosis not present

## 2021-12-24 DIAGNOSIS — R41841 Cognitive communication deficit: Secondary | ICD-10-CM | POA: Diagnosis not present

## 2021-12-28 DIAGNOSIS — R2689 Other abnormalities of gait and mobility: Secondary | ICD-10-CM | POA: Diagnosis not present

## 2021-12-28 DIAGNOSIS — R41841 Cognitive communication deficit: Secondary | ICD-10-CM | POA: Diagnosis not present

## 2021-12-30 DIAGNOSIS — R41841 Cognitive communication deficit: Secondary | ICD-10-CM | POA: Diagnosis not present

## 2021-12-30 DIAGNOSIS — R2689 Other abnormalities of gait and mobility: Secondary | ICD-10-CM | POA: Diagnosis not present

## 2022-01-04 DIAGNOSIS — R2689 Other abnormalities of gait and mobility: Secondary | ICD-10-CM | POA: Diagnosis not present

## 2022-01-04 DIAGNOSIS — R41841 Cognitive communication deficit: Secondary | ICD-10-CM | POA: Diagnosis not present

## 2022-01-05 DIAGNOSIS — R051 Acute cough: Secondary | ICD-10-CM | POA: Diagnosis not present

## 2022-01-11 DIAGNOSIS — R41841 Cognitive communication deficit: Secondary | ICD-10-CM | POA: Diagnosis not present

## 2022-01-11 DIAGNOSIS — R2689 Other abnormalities of gait and mobility: Secondary | ICD-10-CM | POA: Diagnosis not present

## 2022-01-18 DIAGNOSIS — R41841 Cognitive communication deficit: Secondary | ICD-10-CM | POA: Diagnosis not present

## 2022-01-18 DIAGNOSIS — R2689 Other abnormalities of gait and mobility: Secondary | ICD-10-CM | POA: Diagnosis not present

## 2022-01-20 DIAGNOSIS — R2689 Other abnormalities of gait and mobility: Secondary | ICD-10-CM | POA: Diagnosis not present

## 2022-01-20 DIAGNOSIS — R41841 Cognitive communication deficit: Secondary | ICD-10-CM | POA: Diagnosis not present

## 2022-01-26 DIAGNOSIS — R41841 Cognitive communication deficit: Secondary | ICD-10-CM | POA: Diagnosis not present

## 2022-01-26 DIAGNOSIS — R2689 Other abnormalities of gait and mobility: Secondary | ICD-10-CM | POA: Diagnosis not present

## 2022-01-28 DIAGNOSIS — R2689 Other abnormalities of gait and mobility: Secondary | ICD-10-CM | POA: Diagnosis not present

## 2022-01-28 DIAGNOSIS — R41841 Cognitive communication deficit: Secondary | ICD-10-CM | POA: Diagnosis not present

## 2022-02-01 DIAGNOSIS — R2689 Other abnormalities of gait and mobility: Secondary | ICD-10-CM | POA: Diagnosis not present

## 2022-02-01 DIAGNOSIS — R41841 Cognitive communication deficit: Secondary | ICD-10-CM | POA: Diagnosis not present

## 2022-02-11 DIAGNOSIS — R41841 Cognitive communication deficit: Secondary | ICD-10-CM | POA: Diagnosis not present

## 2022-02-16 DIAGNOSIS — R41841 Cognitive communication deficit: Secondary | ICD-10-CM | POA: Diagnosis not present

## 2022-02-18 DIAGNOSIS — R41841 Cognitive communication deficit: Secondary | ICD-10-CM | POA: Diagnosis not present

## 2022-02-22 ENCOUNTER — Encounter: Payer: Self-pay | Admitting: Physician Assistant

## 2022-02-22 ENCOUNTER — Ambulatory Visit: Payer: Medicare HMO | Admitting: Physician Assistant

## 2022-02-22 ENCOUNTER — Telehealth: Payer: Self-pay | Admitting: Physician Assistant

## 2022-02-22 VITALS — BP 154/76 | HR 91 | Resp 18 | Ht 71.0 in | Wt 168.0 lb

## 2022-02-22 DIAGNOSIS — R41841 Cognitive communication deficit: Secondary | ICD-10-CM | POA: Diagnosis not present

## 2022-02-22 DIAGNOSIS — G3184 Mild cognitive impairment, so stated: Secondary | ICD-10-CM | POA: Diagnosis not present

## 2022-02-22 NOTE — Telephone Encounter (Signed)
Pt called in stating Peter Best had told him at a previous visit to take Vitamin D3, but didn't mention that today. He would like to find out if he still needs to take that?

## 2022-02-22 NOTE — Progress Notes (Signed)
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 RH male  with  a history of hypertension, hyperlipidemia, insomnia, osteoarthritis, right bundle branch block, anxiety, depression, chronic GI issues including diarrhea presenting today in follow-up for evaluation of memory loss. Patient is on memantine 10 mg twice daily. 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  During his testing, he did not exhibit the pattern typical of Alzheimer's disease. He is attending speech therapy for communication which he finds very helpful.   MoCA by ST on 02/18/22 was 26/30 (improved from 20/30 on March 2023) . He is stable from the cognitive standpoint.   Recommendations:   Follow up in 6  months. Repeat Neuropsych evaluation in 14 to 20 months to assess the trajectory of future cognitive decline should it occur and for diagnostic clarity. Continue memantine 10 mg twice daily, side effects discussed Continue B12 replenishment Continue speech therapy Continue to control mood as per PCP, continue Zoloft Recommend good control of cardiovascular risk factors.   Consider decreasing trazodone to 25 mg qhs for increased morning sleepiness.    Subjective:   This patient is here alone .Previous records as well as any outside records available were reviewed prior to todays visit. He was last seen on 08/23/2021, and last MoCA on 05/21/2021 was 20/30   Any changes in memory since last visit?  He reports that his memory is stable, able to remember conversations and names of people that he knows, occasionally he may forget the name of the person he just met.  "The only thing is sometimes I forget what I was thinking about so I had to take notes ".  He continues to like to read newspapers, do crossword puzzles and other brain  activities.  He started to do speech therapy for communication, learning different techniques to be able to express himself without any problems.  "I feel that, that is working ". repeats oneself?  Denies  Disoriented when walking into a room?  Patient denies   Leaving objects in unusual places?  Patient denies   Ambulates  with difficulty?   Patient denies   walking, uses the gym, keeps active  Any recent falls or head injuries?  On 10/28/2021 he had a mechanical fall while walking on the sidewalk and his foot got caught in a crack between 2 slabs of concrete.  He landed on his right arm and hit his right forehead, no LOC or dizziness.  He did not have any symptoms of a stroke.  He was taken to the ED for further evaluation, with negative workup History of seizures?   Patient denies   Wandering behavior?  Patient denies   Patient drives?  He denies any issues with driving.  He denies getting lost. Any mood changes since last visit?  Patient denies, although at night, since it is dark outside, it may be feel a little bit more down.  He takes Zoloft for mood control. Any worsening depression?:  Patient denies   Hallucinations?  Patient denies   Paranoia?  Patient denies   Patient reports that sleeps well without vivid dreams, REM behavior or sleepwalking.  He was taking trazodone 50 mg, but "I feel groggy in the morning, I might reduce it back to what it was before at 25 mg nightly ".    History of sleep apnea?  Patient denies   Any hygiene concerns?  Patient denies   Independent of bathing and dressing?  Endorsed  Does the patient needs help with medications?  Patient is in charge  Who is in charge of the finances?  Patient is in charge    Any changes in appetite?  Patient denies    Patient have trouble swallowing? Patient denies   Does the patient cook?  Patient denies   Any kitchen accidents such as leaving the stove on? Patient denies   Any headaches?  Patient denies   Double vision?  Patient denies   Any focal numbness or tingling?  Patient denies   Chronic back pain Patient denies   Unilateral weakness?  Patient denies   Any tremors?  Patient denies   Any history of anosmia?  Patient denies   Any incontinence of urine?  Patient denies   Any bowel dysfunction?   Patient denies      Patient lives with his girlfriend  Neuropsychological evaluation, Dr.Merz August 2023 Briefly, results suggested fairly isolated impairments surrounding cognitive flexibility and delayed retrieval of a previously learned list of words. A relative weakness was noted across expressive language in general, especially semantic fluency. Variability was noted across visuospatial abilities. The etiology for ongoing dysfunction is unclear at the present time. During interview, Peter Best described his most salient concerns surrounding word finding and expressive language. Broadly, this domain did exhibit the most consistent weakness across testing. When considering neurodegenerative illnesses, a primary progressive aphasia (PPA) presentation could be considered as initial language dysfunction is the expectation within this illness. While Peter Best does not display characteristics of the non-fluent and semantic subtypes, patterns across testing could align with the logopenic subtype. Namely, weaknesses surrounding sentence repetition, single word retrieval (list learning task), verbal fluency, and confrontation naming, would align with very early presentations of this illness. His age would be somewhat advanced relative to a typical age of onset for this illness. However, I feel this should remain under consideration as a plausible explanation for day-to-day decline and objective weakness. Regarding concerns for Alzheimer's disease, Peter Best does not exhibit a pattern typical of this illness at the present time. While he was fully amnestic (i.e., 0% retention) across a list learning task, he performed well across  story and figure memory tasks, demonstrating intact memory storage and retrieval abilities. It is worth highlighting that logopenic PPA presentations most often share pathological changes with Alzheimer's disease, making the outward expression of these illnesses quite similar in many cases. Continued medical monitoring will be important moving forward.      Initial visit March 2023 the patient is seen in neurologic consultation at the request of [name], [name], MD for the evaluation of memory.  The patient is here alone.  This is a 81 y.o. year old RH  male who has had memory issues for about 2 years, worse over the last 4 or 5 months.  He states that his main complaint is recalling a word, "I cannot pull it out of the bag right away, and the neighbor told me that he may be a normal aging or not ".  He states that he is more conscious about his memory issues since he turned 81 years old.  He denies repeating himself or being disoriented when walking into her room or leaving objects in unusual places.  He ambulates without difficulty around the community, continues to walk and uses an exercise machine.  He lives in [place] and he is  originally from [place], he moved here upon retirement for more tranquil life.  He lives alone but he has a girlfriend.  He denies any falls other than one in October 2022 when he fractured his right elbow which is "still healing ". Last head injury was about 40 years ago in [place] when he fell on ice and hit the back of his head on asphalt without LOC.  His mood is overall good, without irritability.  He has a history of depression, well controlled with sertraline.  He likes to do word finding, he does not enjoy much board games or other activities.  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  He has intermittent years of insomnia, denies vivid dreams or sleepwalking, hallucinations or paranoia.  He does not feel rested, he reports that his level of energy is decreased, at times he feels sleepy.  His appetite is fair, denies trouble swallowing, he does not cook.  He denies any recent headaches, but he has a history of migraines, periodically he has an event, without aura, and is treated with Tylenol.  He denies double vision, dizziness, focal numbness or tingling, unilateral weakness, tremors or anosmia.  No history of seizures.  He denies urine incontinence, but he does not like to drink enough water, because he does not to urinate that frequently.  He denies constipation.  He has on and off issues with diarrhea.  He denies sleep apnea, alcohol, or tobacco history.  Family history for dementia in his father (unknown type)   Labs December 2022 shows triglycerides 231, total cholesterol 172, LDL 97,   No Known Allergies   MRI brain 4/20231. No acute intracranial abnormality.2. Advanced cerebral atrophy.3. Left cerebellopontine angle lipoma.  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   Cataract    bilateral; removed   Diverticulosis of colon    Essential hypertension 10/01/2018   Fracture of radial head, right, closed 01/11/2021   Gastro-esophageal reflux disease with esophagitis, without bleeding    History of rheumatic heart disease 05/04/2021   Echo-2018 previously described moderately calcified aortic valve-mild regurgitation   History of skin cancer    nose and temple   Hyperlipidemia    Impacted cerumen of both ears 01/21/2020   Inguinal hernia    repaired by surgery   Insomnia    Kidney stones    passed stone - no surgery required   Lumbar pain 06/04/2020   Major depressive disorder in full remission     Migraine    Mild cognitive impairment with memory loss 08/24/2021   Nuclear sclerosis of both eyes 01/01/2018   Osteoarthritis    hip; middle back   Pain in left knee 07/19/2017   Pain of left thigh 07/19/2017   Posterior vitreous detachment of left eye 08/04/2021   Pure hypercholesterolemia 10/01/2018   Right bundle branch block 10/01/2018   Situational anxiety    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   COLONOSCOPY W/ POLYPECTOMY     UNCODED SURGICAL HISTORY   HERNIA REPAIR     INGUINAL HERNIA REPAIR     MOHS SURGERY  2018   x x  on nose   MOHS SURGERY     MOHS' CHEMOSURGERY PROCEDURE DATE: 09/30/2010:BCC R INFERIOR TEMPLE, UNCODED SURGICAL HISTORY   TONSILLECTOMY     WISDOM TOOTH EXTRACTION       PREVIOUS MEDICATIONS:   CURRENT MEDICATIONS:  Outpatient Encounter Medications as of 02/22/2022  Medication Sig   acetaminophen (TYLENOL) 500 MG tablet Take 1,000 mg by mouth every 6 (six) hours as needed for mild pain.   atorvastatin (LIPITOR) 20 MG tablet Take 20 mg by mouth daily.   ibuprofen (ADVIL,MOTRIN) 400 MG tablet Take 400 mg by mouth every 6 (six) hours as needed for mild pain.   lisinopril (ZESTRIL) 20 MG tablet Take 20 mg by mouth daily.   memantine (NAMENDA) 10 MG tablet Take 1 tablet (10 mg total) by mouth 2 (two) times daily.   omeprazole (PRILOSEC) 20 MG capsule Take 20 mg by mouth daily.   sertraline (ZOLOFT) 100 MG tablet Take 1 tablet by mouth as needed.   traZODone (DESYREL) 50 MG tablet Take 1 tablet by mouth as needed.   Facility-Administered Encounter Medications as of 02/22/2022  Medication   0.9 %  sodium chloride infusion     Objective:     PHYSICAL EXAMINATION:    VITALS:   Vitals:   02/22/22 1106  BP: (!) 154/76  Pulse: 91   Resp: 18  SpO2: 99%  Weight: 168 lb (76.2 kg)  Height: _0  (1.803 m)    GEN:  The patient appears stated age and is in NAD. HEENT:  Normocephalic, atraumatic.   Neurological examination:  General: NAD, well-groomed, appears stated age. Orientation: The patient is alert. Oriented to person, place and date Cranial nerves: There is good facial symmetry.The speech is fluent and clear. No aphasia or dysarthria. Fund of knowledge is appropriate. Recent memory impaired and remote memory is normal.  Attention and concentration are normal.  Able to name objects and repeat phrases.  Hearing is intact to conversational tone.    Sensation: Sensation is intact to light touch throughout Motor: Strength is at least antigravity x4. Tremors: none  DTR's 2/4 in UE/LE      05/21/2021    1:00 PM  Montreal Cognitive Assessment   Visuospatial/ Executive (0/5) 1  Naming (0/3) 3  Attention: Read list of digits (0/2) 2  Attention: Read list of letters (0/1) 1  Attention: Serial 7 subtraction starting at 100 (0/3) 2  Language: Repeat phrase (0/2) 2  Language : Fluency (0/1) 1  Abstraction (0/2) 1  Delayed Recall (0/5) 1  Orientation (0/6) 6  Total 20        No data to display             Movement examination: Tone: There is normal tone in the UE/LE Abnormal movements:  no tremor.  No myoclonus.  No asterixis.   Coordination:  There is no decremation with RAM's. Normal finger to nose  Gait and Station: The patient has no difficulty arising out of a deep-seated chair without the use of the hands. The patient's stride length is good.  Gait is cautious and narrow.   Thank you for allowing [place] the opportunity to participate in the care of this nice patient. Please do not hesitate to contact us for any questions or  concerns.   Total time spent on today's visit was 39 minutes dedicated to this patient today, preparing to see patient, examining the patient, ordering tests and/or medications and  counseling the patient, documenting clinical information in the EHR or other health record, independently interpreting results and communicating results to the patient/family, discussing treatment and goals, answering patient's questions and coordinating care.  Cc:  Gaynelle Arabian, MD  [name] 02/22/2022 12:36 PM

## 2022-02-22 NOTE — Patient Instructions (Addendum)
It was a pleasure to see you today at our office.   Recommendations:  Follow up on Jun 13 at 11:30  Continue Memantine 10 mg twice daily.  Continue B12 daily     Whom to call:  Memory  decline, memory medications: Call our office [phone]   For psychiatric meds, mood meds: Please have your primary care physician manage these medications.   Counseling regarding caregiver distress, including caregiver depression, anxiety and issues regarding community resources, adult day care programs, adult living facilities, or memory care questions:   Feel free to contact [place], Social Worker at [phone]   For assessment of decision of mental capacity and competency:  Call Dr. Anthoney Harada, geriatric psychiatrist at [phone]  For guidance in geriatric dementia issues please call [facility] [phone]  For guidance regarding [facility] and if placement were needed at the facility, contact Arnell Asal, Social Worker tel: [phone]  If you have any severe symptoms of a stroke, or other severe issues such as confusion,severe chills or fever, etc call 911 or go to the ER as you may need to be evaluated further     Feel free to go to the following database for funded clinical studies conducted around the world: http://saunders.com/   https://www.triadclinicaltrials.com/     RECOMMENDATIONS FOR ALL PATIENTS WITH MEMORY PROBLEMS: 1. Continue to exercise (Recommend 30 minutes of walking everyday, or 3 hours every week) 2. Increase social interactions - continue going to [place] and enjoy social gatherings with friends and family 3. Eat healthy, avoid fried foods and eat more fruits and vegetables 4. Maintain adequate blood pressure, blood sugar, and blood cholesterol level. Reducing the risk of stroke and cardiovascular disease also helps promoting better memory. 5. Avoid stressful situations. Live a simple life and avoid  aggravations. Organize your time and prepare for the next day in anticipation. 6. Sleep well, avoid any interruptions of sleep and avoid any distractions in the bedroom that may interfere with adequate sleep quality 7. Avoid sugar, avoid sweets as there is a strong link between excessive sugar intake, diabetes, and cognitive impairment We discussed the Mediterranean diet, which has been shown to help patients reduce the risk of progressive memory disorders and reduces cardiovascular risk. This includes eating fish, eat fruits and green leafy vegetables, nuts like almonds and hazelnuts, walnuts, and also use olive oil. Avoid fast foods and fried foods as much as possible. Avoid sweets and sugar as sugar use has been linked to worsening of memory function.  There is always a concern of gradual progression of memory problems. If this is the case, then we may need to adjust level of care according to patient needs. Support, both to the patient and caregiver, should then be put into place.    The Alzheimer's Association is here all day, every day for people facing Alzheimer's disease through our free 24/7 Helpline: (548)182-7163. The Helpline provides reliable information and support to all those who need assistance, such as individuals living with memory loss, Alzheimer's or other dementia, caregivers, health care professionals and the public.  Our highly trained and knowledgeable staff can help you with: Understanding memory loss, dementia and Alzheimer's  Medications and other treatment options  General information about aging and brain health  Skills to provide quality care and to find the best care from professionals  Legal, financial and living-arrangement decisions Our Helpline also features: Confidential care consultation provided by master's level clinicians who can help with decision-making support,  crisis assistance and education on issues families face every day  Help in a caller's preferred  language using our translation service that features more than 200 languages and dialects  Referrals to local community programs, services and ongoing support     FALL PRECAUTIONS: Be cautious when walking. Scan the area for obstacles that may increase the risk of trips and falls. When getting up in the mornings, sit up at the edge of the bed for a few minutes before getting out of bed. Consider elevating the bed at the head end to avoid drop of blood pressure when getting up. Walk always in a well-lit room (use night lights in the walls). Avoid area rugs or power cords from appliances in the middle of the walkways. Use a walker or a cane if necessary and consider physical therapy for balance exercise. Get your eyesight checked regularly.  FINANCIAL OVERSIGHT: Supervision, especially oversight when making financial decisions or transactions is also recommended.  HOME SAFETY: Consider the safety of the kitchen when operating appliances like stoves, microwave oven, and blender. Consider having supervision and share cooking responsibilities until no longer able to participate in those. Accidents with firearms and other hazards in the house should be identified and addressed as well.   ABILITY TO BE LEFT ALONE: If patient is unable to contact 911 operator, consider using LifeLine, or when the need is there, arrange for someone to stay with patients. Smoking is a fire hazard, consider supervision or cessation. Risk of wandering should be assessed by caregiver and if detected at any point, supervision and safe proof recommendations should be instituted.  MEDICATION SUPERVISION: Inability to self-administer medication needs to be constantly addressed. Implement a mechanism to ensure safe administration of the medications.   DRIVING: Regarding driving, in patients with progressive memory problems, driving will be impaired. We advise to have someone else do the driving if trouble finding directions or if  minor accidents are reported. Independent driving assessment is available to determine safety of driving.   If you are interested in the driving assessment, you can contact the following:  [employer] in [place]  [place] [phone] or [phone]      [place] refers to food and lifestyle choices that are based on the traditions of countries located on the [employer]. This way of eating has been shown to help prevent certain conditions and improve outcomes for people who have chronic diseases, like kidney disease and heart disease. What are tips for following this plan? Lifestyle  Cook and eat meals together with your family, when possible. Drink enough fluid to keep your urine clear or pale yellow. Be physically active every day. This includes: Aerobic exercise like running or swimming. Leisure activities like gardening, walking, or housework. Get 7-8 hours of sleep each night. If recommended by your health care provider, drink red wine in moderation. This means 1 glass a day for nonpregnant women and 2 glasses a day for men. A glass of wine equals 5 oz (150 mL). Reading food labels  Check the serving size of packaged foods. For foods such as rice and pasta, the serving size refers to the amount of cooked product, not dry. Check the total fat in packaged foods. Avoid foods that have saturated fat or trans fats. Check the ingredients list for added sugars, such as corn syrup. Shopping  At the grocery store, buy most of your food  from the areas near the walls of the store. This includes: Fresh fruits and vegetables (produce). Grains, beans, nuts, and seeds. Some of these may be available in unpackaged forms or large amounts (in bulk). Fresh seafood. Poultry and eggs. Low-fat dairy products. Buy whole ingredients instead of  prepackaged foods. Buy fresh fruits and vegetables in-season from local farmers markets. Buy frozen fruits and vegetables in resealable bags. If you do not have access to quality fresh seafood, buy precooked frozen shrimp or canned fish, such as tuna, salmon, or sardines. Buy small amounts of raw or cooked vegetables, salads, or olives from the deli or salad bar at your store. Stock your pantry so you always have certain foods on hand, such as olive oil, canned tuna, canned tomatoes, rice, pasta, and beans. Cooking  Cook foods with extra-virgin olive oil instead of using butter or other vegetable oils. Have meat as a side dish, and have vegetables or grains as your main dish. This means having meat in small portions or adding small amounts of meat to foods like pasta or stew. Use beans or vegetables instead of meat in common dishes like chili or lasagna. Experiment with different cooking methods. Try roasting or broiling vegetables instead of steaming or sauteing them. Add frozen vegetables to soups, stews, pasta, or rice. Add nuts or seeds for added healthy fat at each meal. You can add these to yogurt, salads, or vegetable dishes. Marinate fish or vegetables using olive oil, lemon juice, garlic, and fresh herbs. Meal planning  Plan to eat 1 vegetarian meal one day each week. Try to work up to 2 vegetarian meals, if possible. Eat seafood 2 or more times a week. Have healthy snacks readily available, such as: Vegetable sticks with hummus. Greek yogurt. Fruit and nut trail mix. Eat balanced meals throughout the week. This includes: Fruit: 2-3 servings a day Vegetables: 4-5 servings a day Low-fat dairy: 2 servings a day Fish, poultry, or lean meat: 1 serving a day Beans and legumes: 2 or more servings a week Nuts and seeds: 1-2 servings a day Whole grains: 6-8 servings a day Extra-virgin olive oil: 3-4 servings a day Limit red meat and sweets to only a few servings a month What are my  food choices? Mediterranean diet Recommended Grains: Whole-grain pasta. Brown rice. Bulgar wheat. Polenta. Couscous. Whole-wheat bread. [place]. Vegetables: Artichokes. Beets. Broccoli. Cabbage. Carrots. Eggplant. Green beans. Chard. Kale. Spinach. Onions. Leeks. Peas. Squash. Tomatoes. Peppers. Radishes. Fruits: Apples. Apricots. Avocado. Berries. Bananas. Cherries. Dates. Figs. Grapes. Lemons. Melon. Oranges. Peaches. Plums. Pomegranate. Meats and other protein foods: Beans. Almonds. Sunflower seeds. Pine nuts. Peanuts. [place]. Salmon. Scallops. Shrimp. [place]. Tilapia. Clams. Oysters. Eggs. Dairy: Low-fat milk. Cheese. Greek yogurt. Beverages: Water. Red wine. Herbal tea. Fats and oils: Extra virgin olive oil. Avocado oil. Grape seed oil. Sweets and desserts: [place] yogurt with honey. Baked apples. Poached pears. Trail mix. Seasoning and other foods: Basil. Cilantro. Coriander. Cumin. Mint. Parsley. Sage. Rosemary. Tarragon. Garlic. Oregano. Thyme. Pepper. Balsalmic vinegar. Tahini. Hummus. Tomato sauce. Olives. Mushrooms. Limit these Grains: Prepackaged pasta or rice dishes. Prepackaged cereal with added sugar. Vegetables: Deep fried potatoes (french fries). Fruits: Fruit canned in syrup. Meats and other protein foods: Beef. Pork. Lamb. Poultry with skin. Hot dogs. Bernie[place] Salines. Dairy: Ice cream. Sour cream. Whole milk. Beverages: Juice. Sugar-sweetened soft drinks. Beer. Liquor and spirits. Fats and oils: Butter. Canola oil. Vegetable oil. Beef fat (tallow). Lard. Sweets and desserts: Cookies. Cakes. Pies. Candy. Seasoning and other foods: Mayonnaise. Premade  sauces and marinades. The items listed may not be a complete list. Talk with your dietitian about what dietary choices are right for you. Summary The Mediterranean diet includes both food and lifestyle choices. Eat a variety of fresh fruits and vegetables, beans, nuts, seeds, and whole grains. Limit the amount of red meat and sweets  that you eat. Talk with your health care provider about whether it is safe for you to drink red wine in moderation. This means 1 glass a day for nonpregnant women and 2 glasses a day for men. A glass of wine equals 5 oz (150 mL). This information is not intended to replace advice given to you by your health care provider. Make sure you discuss any questions you have with your health care provider. Document Released: 10/22/2015 Document Revised: 11/24/2015 Document Reviewed: 10/22/2015 Elsevier Interactive Patient Education  2017 [employer].

## 2022-02-23 NOTE — Telephone Encounter (Signed)
Left message detailed, to call back if he stills any thing.

## 2022-03-02 DIAGNOSIS — N401 Enlarged prostate with lower urinary tract symptoms: Secondary | ICD-10-CM | POA: Diagnosis not present

## 2022-03-02 DIAGNOSIS — K2100 Gastro-esophageal reflux disease with esophagitis, without bleeding: Secondary | ICD-10-CM | POA: Diagnosis not present

## 2022-03-02 DIAGNOSIS — R69 Illness, unspecified: Secondary | ICD-10-CM | POA: Diagnosis not present

## 2022-03-02 DIAGNOSIS — I10 Essential (primary) hypertension: Secondary | ICD-10-CM | POA: Diagnosis not present

## 2022-03-02 DIAGNOSIS — Z0000 Encounter for general adult medical examination without abnormal findings: Secondary | ICD-10-CM | POA: Diagnosis not present

## 2022-03-02 DIAGNOSIS — E7800 Pure hypercholesterolemia, unspecified: Secondary | ICD-10-CM | POA: Diagnosis not present

## 2022-03-02 DIAGNOSIS — G479 Sleep disorder, unspecified: Secondary | ICD-10-CM | POA: Diagnosis not present

## 2022-03-02 DIAGNOSIS — I4510 Unspecified right bundle-branch block: Secondary | ICD-10-CM | POA: Diagnosis not present

## 2022-03-02 DIAGNOSIS — G3184 Mild cognitive impairment, so stated: Secondary | ICD-10-CM | POA: Diagnosis not present

## 2022-03-17 ENCOUNTER — Telehealth: Payer: Self-pay | Admitting: Cardiology

## 2022-03-17 NOTE — Telephone Encounter (Signed)
Patient is returning call. Please advise? 

## 2022-03-17 NOTE — Telephone Encounter (Signed)
Pt c/o of Chest Pain: STAT if CP now or developed within 24 hours  1. Are you having CP right now? No  2. Are you experiencing any other symptoms (ex. SOB, nausea, vomiting, sweating)? No   3. How long have you been experiencing CP? Couple of months   4. Is your CP continuous or coming and going? Coming and going  5. Have you taken Nitroglycerin? No  ?    Patient reports tightness in chest and in between shoulder blades when exerting himself on a incline. He reports no other symptoms and states it subsides once he is back on level ground. Patient spoke with a nurse regarding this today and was advised to call and schedule with our office due to this. Please advise.

## 2022-03-17 NOTE — Telephone Encounter (Signed)
Attempted to contact pt . Call went to voicemail.  Left message to call back at his convenience.

## 2022-03-17 NOTE — Telephone Encounter (Signed)
Spoke with the patient who reports that for exercise he walks on an incline. Over the past couple of months he has noticed tightness in his chest and pain in between his shoulder blades while doing this. He denies any increased shortness of breath, nausea, vomiting, dizziness, or lightheadedness. He states that pain resolves when he is not on an incline. No pain with rest. No current chest pain. Patient has been scheduled for an appointment on 1/8 and advised on ER precautions in the meantime.

## 2022-03-20 NOTE — Progress Notes (Deleted)
Office Visit    Patient Name: Peter Best Date of Encounter: 03/20/2022  Primary Care Provider:  Gaynelle Arabian, MD Primary Cardiologist:  Candee Furbish, MD Primary Electrophysiologist: None  Chief Complaint    Peter Best is a 82 y.o. male with PMH of HTN, HLD, GERD, RBBB, childhood rheumatic fever who presents today for complaint of chest tightness.  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   Cataract    bilateral; removed   Diverticulosis of colon    Essential hypertension 10/01/2018   Fracture of radial head, right, closed 01/11/2021   Gastro-esophageal reflux disease with esophagitis, without bleeding    History of rheumatic heart disease 05/04/2021   Echo-2018 previously described moderately calcified aortic valve-mild regurgitation   History of skin cancer    nose and temple   Hyperlipidemia    Impacted cerumen of both ears 01/21/2020   Inguinal hernia    repaired by surgery   Insomnia    Kidney stones    passed stone - no surgery required   Lumbar pain 06/04/2020   Major depressive disorder in full remission    Migraine    Mild cognitive impairment with memory loss 08/24/2021   Nuclear sclerosis of both eyes 01/01/2018   Osteoarthritis    hip; middle back   Pain in left knee 07/19/2017   Pain of left thigh 07/19/2017   Posterior vitreous detachment of left eye 08/04/2021   Pure hypercholesterolemia 10/01/2018   Right bundle branch block 10/01/2018   Situational anxiety    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   COLONOSCOPY W/ POLYPECTOMY     UNCODED SURGICAL HISTORY   HERNIA REPAIR     INGUINAL  HERNIA REPAIR     MOHS SURGERY  2018   x x  on nose   MOHS SURGERY     MOHS' CHEMOSURGERY PROCEDURE DATE: 09/30/2010:BCC R INFERIOR TEMPLE, UNCODED SURGICAL HISTORY   TONSILLECTOMY     WISDOM TOOTH EXTRACTION      Allergies  Allergies  Allergen Reactions   Levonorgestrel-Ethinyl Estrad Other (See Comments)    History of Present Illness    Peter Best  is a 82year old male with the above mention past medical history who presents today for complaint of chest tightness with exertion.  Peter Best was initially seen by Dr. Marlou Porch in 2018 by self-referral for management of hypertension.  He has a history of childhood rheumatic fever and 2D echo was completed 07/2017 that showed moderately thickened MV with no stenosis.  He was last seen by Dr. Marlou Porch on 05/04/2021 and reported doing well.  He completed PAD screenings for lower extremities that showed abnormal result in both left and right-sided legs.  He denied any claudication symptoms and had normal pulses bilaterally.  Carotid auscultation was also completed that revealed no bruit.  Since last being seen in the office patient reports***.  Patient denies chest pain, palpitations, dyspnea, PND, orthopnea, nausea, vomiting, dizziness, syncope, edema, weight gain, or early satiety.   ***Notes: -Will need ischemic evaluation Complaint of chest pain with activity located between shoulder blades -Last 2D echo was 2018 normal EF  and soft murmur Home Medications    Current Outpatient Medications  Medication Sig Dispense Refill   acetaminophen (TYLENOL) 500 MG tablet Take 1,000 mg by mouth every 6 (six) hours as needed for mild pain.     atorvastatin (LIPITOR) 20 MG tablet Take 20 mg by mouth daily.     ibuprofen (ADVIL,MOTRIN) 400 MG tablet Take 400 mg by mouth every 6 (six) hours as needed for mild pain.     lisinopril (ZESTRIL) 20 MG tablet Take 20 mg by mouth daily.     memantine (NAMENDA) 10 MG tablet Take 1 tablet (10 mg total) by mouth 2  (two) times daily. 180 tablet 4   omeprazole (PRILOSEC) 20 MG capsule Take 20 mg by mouth daily.     sertraline (ZOLOFT) 100 MG tablet Take 1 tablet by mouth as needed.     traZODone (DESYREL) 50 MG tablet Take 1 tablet by mouth as needed.     Current Facility-Administered Medications  Medication Dose Route Frequency Provider Last Rate Last Admin   0.9 %  sodium chloride infusion  500 mL Intravenous Once Irene Shipper, MD         Review of Systems  Please see the history of present illness.    (+)*** (+)***  All other systems reviewed and are otherwise negative except as noted above.  Physical Exam    Wt Readings from Last 3 Encounters:  02/22/22 168 lb (76.2 kg)  10/28/21 162 lb (73.5 kg)  08/23/21 165 lb (74.8 kg)   XT:KWIOX were no vitals filed for this visit.,There is no height or weight on file to calculate BMI.  Constitutional:      Appearance: Healthy appearance. Not in distress.  Neck:     Vascular: JVD normal.  Pulmonary:     Effort: Pulmonary effort is normal.     Breath sounds: No wheezing. No rales. Diminished in the bases Cardiovascular:     Normal rate. Regular rhythm. Normal S1. Normal S2.      Murmurs: There is no murmur.  Edema:    Peripheral edema absent.  Abdominal:     Palpations: Abdomen is soft non tender. There is no hepatomegaly.  Skin:    General: Skin is warm and dry.  Neurological:     General: No focal deficit present.     Mental Status: Alert and oriented to person, place and time.     Cranial Nerves: Cranial nerves are intact.  EKG/LABS/Other Studies Reviewed    ECG personally reviewed by me today - ***  Risk Assessment/Calculations:   {Does this patient have ATRIAL FIBRILLATION?:[phone]}        Lab Results  Component Value Date   WBC 7.5 12/05/2020   HGB 15.4 12/05/2020   HCT 46.5 12/05/2020   MCV 93.9 12/05/2020   PLT 174 12/05/2020   Lab Results  Component Value Date   CREATININE 0.78 12/05/2020   BUN 17 12/05/2020    NA 139 12/05/2020   K 3.8 12/05/2020   CL 106 12/05/2020   CO2 26 12/05/2020   Lab Results  Component Value Date   ALT 13 12/05/2020   AST 18 12/05/2020   ALKPHOS 47 12/05/2020   BILITOT 1.0 12/05/2020   No results found for: "CHOL", "HDL", "LDLCALC", "LDLDIRECT", "TRIG", "CHOLHDL"  No results found for: "HGBA1C"  Assessment & Plan    1.  Chest pain: -Patient contacted office 03/17/2022 with complaint of chest tightness between his shoulder blades with activity. -Today patient reports*** -  2.  Essential hypertension: -Patient's blood pressure today was*** -Continue lisinopril 20 mg daily  3.  Hyperlipidemia: -Patient's last LDL cholesterol was 97 above goal of less than 70. -Continue Lipitor 20 mg daily  4.  History of rheumatic heart disease: -Patient has history of childhood rheumatic fever. -Last 2D echo completed 2018 showing normal EF and thickened aortic valve leaflets with mild regurgitation  5.  Right bundle branch block: -      Disposition: Follow-up with Candee Furbish, MD or APP in *** months {Are you ordering a CV Procedure (e.g. stress test, cath, DCCV, TEE, etc)?   Press F2        :[id number]}   Medication Adjustments/Labs and Tests Ordered: Current medicines are reviewed at length with the patient today.  Concerns regarding medicines are outlined above.   Signed, Mable Fill, Marissa Nestle, NP 03/20/2022, 9:51 PM [facility]  Note:  This document was prepared using Dragon voice recognition software and may include unintentional dictation errors.

## 2022-03-21 ENCOUNTER — Ambulatory Visit: Payer: Medicare HMO | Admitting: Nurse Practitioner

## 2022-03-21 DIAGNOSIS — R079 Chest pain, unspecified: Secondary | ICD-10-CM

## 2022-03-29 DIAGNOSIS — R051 Acute cough: Secondary | ICD-10-CM | POA: Diagnosis not present

## 2022-03-29 DIAGNOSIS — Z03818 Encounter for observation for suspected exposure to other biological agents ruled out: Secondary | ICD-10-CM | POA: Diagnosis not present

## 2022-03-29 DIAGNOSIS — R0982 Postnasal drip: Secondary | ICD-10-CM | POA: Diagnosis not present

## 2022-03-29 DIAGNOSIS — B349 Viral infection, unspecified: Secondary | ICD-10-CM | POA: Diagnosis not present

## 2022-03-29 DIAGNOSIS — R5383 Other fatigue: Secondary | ICD-10-CM | POA: Diagnosis not present

## 2022-04-11 ENCOUNTER — Telehealth: Payer: Self-pay | Admitting: Physician Assistant

## 2022-04-11 NOTE — Telephone Encounter (Signed)
I left detailed message to call back with an updated report.

## 2022-04-11 NOTE — Progress Notes (Unsigned)
Office Visit    Patient Name: Peter Best Date of Encounter: 04/11/2022  Primary Care Provider:  Gaynelle Arabian, MD Primary Cardiologist:  Candee Furbish, MD Primary Electrophysiologist: None  Chief Complaint    Peter Best is a 82 y.o. male with PMH of HTN, HLD, GERD, RBBB, childhood rheumatic fever who presents today for complaint of chest tightness.   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   Cataract    bilateral; removed   Diverticulosis of colon    Essential hypertension 10/01/2018   Fracture of radial head, right, closed 01/11/2021   Gastro-esophageal reflux disease with esophagitis, without bleeding    History of rheumatic heart disease 05/04/2021   Echo-2018 previously described moderately calcified aortic valve-mild regurgitation   History of skin cancer    nose and temple   Hyperlipidemia    Impacted cerumen of both ears 01/21/2020   Inguinal hernia    repaired by surgery   Insomnia    Kidney stones    passed stone - no surgery required   Lumbar pain 06/04/2020   Major depressive disorder in full remission    Migraine    Mild cognitive impairment with memory loss 08/24/2021   Nuclear sclerosis of both eyes 01/01/2018   Osteoarthritis    hip; middle back   Pain in left knee 07/19/2017   Pain of left thigh 07/19/2017   Posterior vitreous detachment of left eye 08/04/2021   Pure hypercholesterolemia 10/01/2018   Right bundle branch block 10/01/2018   Situational anxiety    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  Peter Best was initially seen by Dr. Marlou Porch in 2018 by self-referral for management of hypertension.  He has a history of childhood rheumatic fever and 2D echo was completed 07/2017 that showed moderately thickened MV with no stenosis.  He was last seen by Dr. Marlou Porch on 05/04/2021 and reported doing well.  He completed PAD screenings for lower extremities that showed abnormal result in both left and right-sided legs.  He denied any claudication symptoms and had normal pulses bilaterally.  Carotid auscultation was also completed that revealed no bruit.   Peter Best presents today alone for complaint of chest pain.  Since last being seen in the office patient reports that he has been doing well but does experience occasional intermittent bouts of chest pressure.  He reports that these are worse with activity such as climbing stairs or going up  a hill.  He denies any shortness of breath, dizziness, or palpitations.  His blood pressure today is well-controlled at 126/80 and heart rate is 94 bpm.  He is compliant with his current medications and denies any adverse reactions.  He reports that his chest pressure gets better with rest and does not recur following an episode. Patient denies  palpitations, dyspnea, PND, orthopnea, nausea, vomiting, dizziness, syncope, edema, weight gain, or early satiety.     Home Medications    Current Outpatient Medications  Medication Sig Dispense Refill   acetaminophen  (TYLENOL) 500 MG tablet Take 1,000 mg by mouth every 6 (six) hours as needed for mild pain.     atorvastatin (LIPITOR) 20 MG tablet Take 20 mg by mouth daily.     ibuprofen (ADVIL,MOTRIN) 400 MG tablet Take 400 mg by mouth every 6 (six) hours as needed for mild pain.     lisinopril (ZESTRIL) 20 MG tablet Take 20 mg by mouth daily.     memantine (NAMENDA) 10 MG tablet Take 1 tablet (10 mg total) by mouth 2 (two) times daily. 180 tablet 4   omeprazole (PRILOSEC) 20 MG capsule Take 20 mg by mouth daily.     sertraline (ZOLOFT) 100 MG tablet Take 1 tablet by mouth as needed.     traZODone (DESYREL) 50 MG tablet Take 1 tablet by mouth as needed.     Current Facility-Administered Medications  Medication Dose Route Frequency Provider Last Rate Last Admin   0.9 %  sodium chloride infusion  500 mL Intravenous Once Irene Shipper, MD         Review of Systems  Please see the history of present illness.    (+) Chest pressure (+) Shortness of breath  All other systems reviewed and are otherwise negative except as noted above.  Physical Exam    Wt Readings from Last 3 Encounters:  02/22/22 168 lb (76.2 kg)  10/28/21 162 lb (73.5 kg)  08/23/21 165 lb (74.8 kg)   GY:IRSWN were no vitals filed for this visit.,There is no height or weight on file to calculate BMI.  Constitutional:      Appearance: Healthy appearance. Not in distress.  Neck:     Vascular: JVD normal.  Pulmonary:     Effort: Pulmonary effort is normal.     Breath sounds: No wheezing. No rales. Diminished in the bases Cardiovascular:     Normal rate. Regular rhythm. Normal S1. Normal S2.      Murmurs: There is no murmur.  Edema:    Peripheral edema absent.  Abdominal:     Palpations: Abdomen is soft non tender. There is no hepatomegaly.  Skin:    General: Skin is warm and dry.  Neurological:     General: No focal deficit present.     Mental Status: Alert and oriented to person, place and time.     Cranial Nerves: Cranial  nerves are intact.  EKG/LABS/Other Studies Reviewed    ECG personally reviewed by me today -sinus rhythm with right bundle branch block and rate of 94 bpm with no acute changes consistent with previous EKG.    Lab Results  Component Value Date   WBC 7.5 12/05/2020   HGB 15.4 12/05/2020   HCT 46.5 12/05/2020   MCV 93.9 12/05/2020   PLT 174 12/05/2020   Lab Results  Component Value Date   CREATININE 0.78 12/05/2020   BUN 17 12/05/2020   NA 139 12/05/2020   K 3.8 12/05/2020  CL 106 12/05/2020   CO2 26 12/05/2020   Lab Results  Component Value Date   ALT 13 12/05/2020   AST 18 12/05/2020   ALKPHOS 47 12/05/2020   BILITOT 1.0 12/05/2020   No results found for: "CHOL", "HDL", "LDLCALC", "LDLDIRECT", "TRIG", "CHOLHDL"  No results found for: "HGBA1C"  Assessment & Plan    1.  Chest pain: -Patient contacted office 03/17/2022 with complaint of chest tightness between his shoulder blades with activity. -Today patient reports that he is experienced intermittent bouts of chest pressure that extends sometimes to his back and between his shoulder blades. -He is not had a recent ischemic evaluation and we will send for Lexiscan Myoview to rule out any ischemia -We will also obtain 2D echo to rule out any structural or valvular abnormalities.   2.  Essential hypertension: -Patient's blood pressure today was well-controlled at 126/80 -Continue lisinopril 20 mg daily   3.  Hyperlipidemia: -Patient's last LDL cholesterol was 97 above goal of less than 70. -Continue Lipitor 20 mg daily   4.  History of rheumatic heart disease: -Patient has history of childhood rheumatic fever. -Last 2D echo completed 2018 showing normal EF and thickened aortic valve leaflets with mild regurgitation -We will repeat 2D echo as noted above   5.  Right bundle branch block: -He denies any syncope but does note occasional bouts of shortness of breath with activity. -2D echo and Lexiscan pending as noted  above.    Disposition: Follow-up with Candee Furbish, MD or APP in 2 months Shared Decision Making/Informed Consent The risks [chest pain, shortness of breath, cardiac arrhythmias, dizziness, blood pressure fluctuations, myocardial infarction, stroke/transient ischemic attack, nausea, vomiting, allergic reaction, radiation exposure, metallic taste sensation and life-threatening complications (estimated to be 1 in 10,000)], benefits (risk stratification, diagnosing coronary artery disease, treatment guidance) and alternatives of a nuclear stress test were discussed in detail with Peter Best and he agrees to proceed.   Medication Adjustments/Labs and Tests Ordered: Current medicines are reviewed at length with the patient today.  Concerns regarding medicines are outlined above.   Signed, Mable Fill, Marissa Nestle, NP 04/11/2022, 4:03 PM [facility]  Note:  This document was prepared using Dragon voice recognition software and may include unintentional dictation errors.

## 2022-04-11 NOTE — Telephone Encounter (Signed)
Pt called in wanting to get a call back about an incident that happened yesterday regarding his mild cognitive impairment. He did not want to give details.

## 2022-04-12 ENCOUNTER — Ambulatory Visit: Payer: Medicare HMO | Attending: Nurse Practitioner | Admitting: Nurse Practitioner

## 2022-04-12 ENCOUNTER — Encounter: Payer: Self-pay | Admitting: Nurse Practitioner

## 2022-04-12 VITALS — BP 126/80 | HR 94 | Ht 71.0 in | Wt 167.4 lb

## 2022-04-12 DIAGNOSIS — I4510 Unspecified right bundle-branch block: Secondary | ICD-10-CM

## 2022-04-12 DIAGNOSIS — I10 Essential (primary) hypertension: Secondary | ICD-10-CM | POA: Diagnosis not present

## 2022-04-12 DIAGNOSIS — E7800 Pure hypercholesterolemia, unspecified: Secondary | ICD-10-CM | POA: Diagnosis not present

## 2022-04-12 DIAGNOSIS — R079 Chest pain, unspecified: Secondary | ICD-10-CM | POA: Diagnosis not present

## 2022-04-12 DIAGNOSIS — R0602 Shortness of breath: Secondary | ICD-10-CM | POA: Diagnosis not present

## 2022-04-12 MED ORDER — METOPROLOL SUCCINATE ER 25 MG PO TB24: 12.5000 mg | ORAL_TABLET | Freq: Every day | ORAL | 3 refills | Status: DC

## 2022-04-12 NOTE — Telephone Encounter (Signed)
Pt called in returning Peter Best call. He says he was having a conversation with a friend and remembers things that were never said. He wanted to find out if this is part of his MCI?

## 2022-04-12 NOTE — Telephone Encounter (Signed)
Patient advised of the above, he thanked me for calling.

## 2022-04-12 NOTE — Patient Instructions (Signed)
Medication Instructions:  START Metoprolol Succinate 12.'5mg'$  Take 1 tablet daily  *If you need a refill on your cardiac medications before your next appointment, please call your pharmacy*  Lab Work: None ordered  Testing/Procedures: Your physician has requested that you have an echocardiogram. Echocardiography is a painless test that uses sound waves to create images of your heart. It provides your doctor with information about the size and shape of your heart and how well your heart's chambers and valves are working. This procedure takes approximately one hour. There are no restrictions for this procedure. Please do NOT wear cologne, perfume, aftershave, or lotions (deodorant is allowed). Please arrive 15 minutes prior to your appointment time.  Your physician has requested that you have a lexiscan myoview. For further information please visit [email or web address]. Please follow instruction sheet, as given.   Follow-Up: At [facility], you and your health needs are our priority.  As part of our continuing mission to provide you with exceptional heart care, we have created designated Provider Care Teams.  These Care Teams include your primary Cardiologist (physician) and Advanced Practice Providers (APPs -  Physician Assistants and Nurse Practitioners) who all work together to provide you with the care you need, when you need it.  We recommend signing up for the patient portal called "MyChart".  Sign up information is provided on this After Visit Summary.  MyChart is used to connect with patients for Virtual Visits (Telemedicine).  Patients are able to view lab/test results, encounter notes, upcoming appointments, etc.  Non-urgent messages can be sent to your provider as well.   To learn more about what you can do with MyChart, go to [email or web address].    Your next appointment:   2 month(s)  Provider:   Ambrose Pancoast, NP       Other Instructions Check your blood pressure  daily for 2 weeks then contact the office with your readings

## 2022-04-15 ENCOUNTER — Telehealth: Payer: Self-pay | Admitting: Cardiology

## 2022-04-15 ENCOUNTER — Telehealth (HOSPITAL_COMMUNITY): Payer: Self-pay | Admitting: Nurse Practitioner

## 2022-04-15 NOTE — Telephone Encounter (Signed)
Pt returning nurse's call. Please advise

## 2022-04-15 NOTE — Telephone Encounter (Signed)
Left message to call the clinic.

## 2022-04-15 NOTE — Telephone Encounter (Signed)
Pt is requesting call back to discuss future test and medical issues that he would like to discuss with Ambrose Pancoast, NP.  Please advise.

## 2022-04-15 NOTE — Telephone Encounter (Signed)
Patient called and cancelled echo and Myoview for reason below:  04/15/2022 10:28 AM TU:[id number], BELISICIA T  Cancel Rsn: Patient (Does not want done at the [facility], will callback to reschedule)  Order will be removed from the echo/nuc WQ and if patient calls back we will reinstate the orders. Thank you

## 2022-04-18 NOTE — Telephone Encounter (Signed)
See above note. Patient called in a cancelled testing.

## 2022-04-19 ENCOUNTER — Encounter (HOSPITAL_COMMUNITY): Payer: Self-pay

## 2022-04-19 ENCOUNTER — Encounter (HOSPITAL_COMMUNITY): Payer: Medicare HMO

## 2022-05-03 ENCOUNTER — Other Ambulatory Visit (HOSPITAL_COMMUNITY): Payer: Medicare HMO

## 2022-05-19 ENCOUNTER — Telehealth: Payer: Self-pay | Admitting: Physician Assistant

## 2022-05-19 DIAGNOSIS — L814 Other melanin hyperpigmentation: Secondary | ICD-10-CM | POA: Diagnosis not present

## 2022-05-19 DIAGNOSIS — Z08 Encounter for follow-up examination after completed treatment for malignant neoplasm: Secondary | ICD-10-CM | POA: Diagnosis not present

## 2022-05-19 DIAGNOSIS — L821 Other seborrheic keratosis: Secondary | ICD-10-CM | POA: Diagnosis not present

## 2022-05-19 DIAGNOSIS — L570 Actinic keratosis: Secondary | ICD-10-CM | POA: Diagnosis not present

## 2022-05-19 DIAGNOSIS — D225 Melanocytic nevi of trunk: Secondary | ICD-10-CM | POA: Diagnosis not present

## 2022-05-19 DIAGNOSIS — Z85828 Personal history of other malignant neoplasm of skin: Secondary | ICD-10-CM | POA: Diagnosis not present

## 2022-05-19 NOTE — Telephone Encounter (Signed)
Pt was reading about Meilin Sheath and would like to ask some questions about that.

## 2022-05-19 NOTE — Telephone Encounter (Signed)
Found a book, myelin sheath may be increased to makes brain act faster, cognitive functioning, wants to disuss this with you, can we set up VV or can you call him.thanks

## 2022-05-19 NOTE — Telephone Encounter (Signed)
Peter Best, no he has concerns, regarding the mylein sheath, wants to discuss several things with you regarding this, thanks

## 2022-05-19 NOTE — Telephone Encounter (Signed)
No answer at 2:58 05/19/2022

## 2022-05-20 NOTE — Telephone Encounter (Signed)
Oops, verify appt time with him thxs

## 2022-05-20 NOTE — Telephone Encounter (Signed)
Hey, can you call patient back and let him know , she will discuss issues with him on follow up , thanks

## 2022-05-23 ENCOUNTER — Ambulatory Visit: Payer: Medicare HMO | Admitting: Physician Assistant

## 2022-05-23 ENCOUNTER — Telehealth: Payer: Self-pay | Admitting: Anesthesiology

## 2022-05-23 ENCOUNTER — Encounter: Payer: Self-pay | Admitting: Physician Assistant

## 2022-05-23 VITALS — BP 137/73 | HR 74 | Resp 18 | Ht 71.0 in | Wt 167.0 lb

## 2022-05-23 DIAGNOSIS — G3184 Mild cognitive impairment, so stated: Secondary | ICD-10-CM

## 2022-05-23 NOTE — Progress Notes (Signed)
  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  It was explained to him that myelin is an insulating layer surrounding the axons which allow electrical impulses to transmit quickly and in an efficient manner along the neurons.  Therefore, if damage, this impulses would slow affecting the conduction.   He was shown his on MRI as well, and all the structures were discussed in detail to him. Of note, his last neuropsychological evaluation was not concerning for Alzheimer's disease.  He had other molecular questions regarding cortisol and I recommended that he discuss with primary care physician regarding high cortisol levels and if necessary to draw them and, if abnormal and may warrant a visit to endocrinologist.  The patient has an appointment with me in June to follow-up on memory, and will keep that appointment at which time we will perform an MMSE to compare from prior and determine if there is any need to add donepezil to the regimen.  During his prior neuropsychological evaluation it was recommended that he repeat the test in 18 to 24 months after the last test (August 2023).  Will monitor at that time. In the meantime, the patient is able to perform most activities of daily living, reading extensively, exercising, eating properly, but it was recommended to him that he consider relaxation techniques, as high anxiety, may affect memory to a certain extent.  All his questions were answered, and he was satisfied with the visit.  Will see him again in the scheduled visit

## 2022-05-23 NOTE — Telephone Encounter (Signed)
Pt called stating he would like to ask Peter Best about the medication Famotidine that was prescribed by PCP, Peter Best advised him to stop taking Omeprazole. States PCP prescribed Famotidine and some of the possible side effects are confusion, trouble remembering things and people over 65 are more sensitive to this side effects. Pt would like to know if he is ok to take it.

## 2022-05-24 NOTE — Telephone Encounter (Signed)
I left detailed message okay to take famotidine

## 2022-06-07 ENCOUNTER — Ambulatory Visit: Payer: Medicare HMO | Admitting: Nurse Practitioner

## 2022-06-22 ENCOUNTER — Emergency Department (HOSPITAL_BASED_OUTPATIENT_CLINIC_OR_DEPARTMENT_OTHER): Payer: Medicare HMO | Admitting: Radiology

## 2022-06-22 ENCOUNTER — Encounter (HOSPITAL_BASED_OUTPATIENT_CLINIC_OR_DEPARTMENT_OTHER): Payer: Self-pay | Admitting: Emergency Medicine

## 2022-06-22 ENCOUNTER — Other Ambulatory Visit: Payer: Self-pay

## 2022-06-22 ENCOUNTER — Emergency Department (HOSPITAL_BASED_OUTPATIENT_CLINIC_OR_DEPARTMENT_OTHER)
Admission: EM | Admit: 2022-06-22 | Discharge: 2022-06-22 | Disposition: A | Discharge status: Home / Self Care | Payer: Medicare HMO | Attending: Emergency Medicine | Admitting: Emergency Medicine

## 2022-06-22 DIAGNOSIS — M79672 Pain in left foot: Secondary | ICD-10-CM | POA: Insufficient documentation

## 2022-06-22 DIAGNOSIS — Z79899 Other long term (current) drug therapy: Secondary | ICD-10-CM | POA: Insufficient documentation

## 2022-06-22 DIAGNOSIS — I10 Essential (primary) hypertension: Secondary | ICD-10-CM | POA: Insufficient documentation

## 2022-06-22 DIAGNOSIS — M7989 Other specified soft tissue disorders: Secondary | ICD-10-CM | POA: Diagnosis not present

## 2022-06-22 NOTE — ED Notes (Signed)
X-ray at bedside

## 2022-06-22 NOTE — ED Notes (Signed)
Dc instructions reviewed with patient. Patient voiced understanding. Dc with belongings.  °

## 2022-06-22 NOTE — Discharge Instructions (Signed)
Take ibuprofen and tylenol every 6 hrs as needed for pain. If no improvement with rest see sports medicine or orthopaedics.

## 2022-06-22 NOTE — ED Triage Notes (Signed)
Pt arrives pov, steady slow gait with c/o LT foot lateral swelling x 1 week. Denies injury. Reports swelling increases with ambulation

## 2022-06-27 NOTE — ED Provider Notes (Signed)
[place] EMERGENCY DEPARTMENT AT [facility] Provider Note   CSN: [id number] Arrival date & time: 06/22/22  1006     History  Chief Complaint  Patient presents with   Foot Swelling    Terryn Redner is a 82 y.o. male.  Patient presents with mild left lateral foot swelling and discomfort for 1 week.  No injuries recalled.  Worse with ambulation putting weight on it.  No leg swelling, weight gain, heart failure or blood clot history.  Patient has history of high blood pressure compliant medications.  No redness fevers or chills.       Home Medications Prior to Admission medications   Medication Sig Start Date End Date Taking? Authorizing Provider  famotidine (PEPCID) 20 MG tablet Take 20 mg by mouth 2 (two) times daily. 03/14/22  Yes [provider]  acetaminophen (TYLENOL) 500 MG tablet Take 1,000 mg by mouth every 6 (six) hours as needed for mild pain.    [provider]  atorvastatin (LIPITOR) 20 MG tablet Take 20 mg by mouth daily. 05/06/19   [provider]  ibuprofen (ADVIL,MOTRIN) 400 MG tablet Take 400 mg by mouth every 6 (six) hours as needed for mild pain.    [provider]  lisinopril (ZESTRIL) 20 MG tablet Take 20 mg by mouth daily. 03/31/21   [provider]  memantine (NAMENDA) 10 MG tablet Take 1 tablet (10 mg total) by mouth 2 (two) times daily. 10/20/21   Marcos Eke, PA-C  metoprolol succinate (TOPROL XL) 25 MG 24 hr tablet Take 0.5 tablets (12.5 mg total) by mouth daily. 04/12/22   Gaston Islam., NP  omeprazole (PRILOSEC) 20 MG capsule Take 20 mg by mouth daily.    [provider]  sertraline (ZOLOFT) 100 MG tablet Take 1 tablet by mouth as needed.    [provider]  traZODone (DESYREL) 50 MG tablet Take 1 tablet by mouth as needed.    [provider]      Allergies    Levonorgestrel-ethinyl estrad    Review of Systems   Review of Systems  Constitutional:  Negative for  chills and fever.  HENT:  Negative for congestion.   Eyes:  Negative for visual disturbance.  Respiratory:  Negative for shortness of breath.   Cardiovascular:  Negative for chest pain.  Gastrointestinal:  Negative for abdominal pain and vomiting.  Genitourinary:  Negative for dysuria and flank pain.  Musculoskeletal:  Negative for back pain, neck pain and neck stiffness.  Skin:  Negative for rash.  Neurological:  Negative for light-headedness and headaches.    Physical Exam Updated Vital Signs BP 120/68   Pulse 97   Temp 97.9 F (36.6 C) (Oral)   Resp 16   Ht  (1.803 m)   Wt 74.6 kg   SpO2 99%   BMI 22.94 kg/m  Physical Exam Vitals and nursing note reviewed.  Constitutional:      General: He is not in acute distress.    Appearance: He is well-developed.  HENT:     Head: Normocephalic and atraumatic.     Mouth/Throat:     Mouth: Mucous membranes are moist.  Eyes:     General:        Right eye: No discharge.        Left eye: No discharge.  Neck:     Trachea: No tracheal deviation.  Cardiovascular:     Rate and Rhythm: Normal rate.  Pulmonary:  Effort: Pulmonary effort is normal.  Abdominal:     General: There is no distension.  Musculoskeletal:        General: Tenderness present. No swelling.     Cervical back: Normal range of motion and neck supple. No rigidity.     Comments: Patient has mild discomfort with palpation of left mid lateral foot.  No warmth, erythema or significant edema.  Neurovascular intact left foot.  No swelling or pitting edema in the left lower leg.  Full range of motion without significant discomfort.  Skin:    General: Skin is warm.     Capillary Refill: Capillary refill takes less than 2 seconds.     Findings: No rash.  Neurological:     General: No focal deficit present.     Mental Status: He is alert.  Psychiatric:        Mood and Affect: Mood normal.     ED Results / Procedures / Treatments   Labs (all labs ordered are  listed, but only abnormal results are displayed) Labs Reviewed - No data to display  EKG None  Radiology No results found.  Procedures Procedures    Medications Ordered in ED Medications - No data to display  ED Course/ Medical Decision Making/ A&P                             Medical Decision Making Amount and/or Complexity of Data Reviewed Radiology: ordered.   Patient presents with left foot pain broad differential including contusion/occult fracture less likely given no significant trauma, no signs of infection on exam, screening x-ray performed independently reviewed no acute abnormalities, no other joints involved to suggest systemic disease, no blood clot risk factors or leg edema or shortness of breath to suggest DVT.  Likely musculoskeletal.  Patient stable for outpatient follow-up for further evaluation.  Discussed supportive care and reasons to return.  Patient comfortable plan.        Final Clinical Impression(s) / ED Diagnoses Final diagnoses:  Acute foot pain, left    Rx / DC Orders ED Discharge Orders     None         Blane Ohara, MD 06/27/22 [phone]

## 2022-07-11 ENCOUNTER — Telehealth: Payer: Self-pay | Admitting: Nurse Practitioner

## 2022-07-11 DIAGNOSIS — R079 Chest pain, unspecified: Secondary | ICD-10-CM

## 2022-07-11 NOTE — Telephone Encounter (Signed)
Spoke with the patient who states that he would prefer to have a treadmill stress test rather than a lexiscan. He is uncomfortable with doing a chemical stress test and would prefer the treadmill. Will send to [facility] to see if this is okay to order.

## 2022-07-11 NOTE — Telephone Encounter (Signed)
Spoke with pt and he would like to proceed with Treadmill stress test.  Pt states he does not have daily chest discomfort and understands he should not undertake testing if he develops chest pain on the day of testing.  Pt advised he will be contacted re: scheduling.  Pt verbalizes understanding and agrees with current plan.

## 2022-07-11 NOTE — Telephone Encounter (Signed)
Left message for patient to call back  

## 2022-07-11 NOTE — Telephone Encounter (Signed)
Pt would like a c/b from [place] NP. He states he was initially scheduled for a lexiscan on 1/30. He opted to not take it due to some concerns/questions he had. However, now he would like to talk about addressing those concerns by doing a stress test.

## 2022-07-11 NOTE — Telephone Encounter (Signed)
Order placed for GXT.

## 2022-07-11 NOTE — Telephone Encounter (Signed)
Patient returned RN's call and requested call back on mobile# [phone].

## 2022-07-11 NOTE — Telephone Encounter (Signed)
Patient is returning call and requesting call back.

## 2022-07-12 ENCOUNTER — Ambulatory Visit: Payer: Medicare HMO | Admitting: Psychology

## 2022-07-13 DIAGNOSIS — H524 Presbyopia: Secondary | ICD-10-CM | POA: Diagnosis not present

## 2022-07-13 DIAGNOSIS — Z961 Presence of intraocular lens: Secondary | ICD-10-CM | POA: Diagnosis not present

## 2022-07-13 DIAGNOSIS — H52203 Unspecified astigmatism, bilateral: Secondary | ICD-10-CM | POA: Diagnosis not present

## 2022-07-13 DIAGNOSIS — H5213 Myopia, bilateral: Secondary | ICD-10-CM | POA: Diagnosis not present

## 2022-07-21 ENCOUNTER — Ambulatory Visit
Admission: RE | Admit: 2022-07-21 | Discharge: 2022-07-21 | Disposition: A | Discharge status: Home / Self Care | Payer: Medicare HMO | Source: Ambulatory Visit | Attending: Family Medicine | Admitting: Family Medicine

## 2022-07-21 ENCOUNTER — Other Ambulatory Visit: Payer: Self-pay | Admitting: Family Medicine

## 2022-07-21 DIAGNOSIS — Z9989 Dependence on other enabling machines and devices: Secondary | ICD-10-CM | POA: Diagnosis not present

## 2022-07-21 DIAGNOSIS — T1490XA Injury, unspecified, initial encounter: Secondary | ICD-10-CM

## 2022-07-21 DIAGNOSIS — S7002XA Contusion of left hip, initial encounter: Secondary | ICD-10-CM | POA: Diagnosis not present

## 2022-07-21 DIAGNOSIS — M25552 Pain in left hip: Secondary | ICD-10-CM | POA: Diagnosis not present

## 2022-07-25 ENCOUNTER — Telehealth: Payer: Self-pay | Admitting: Physician Assistant

## 2022-07-25 NOTE — Telephone Encounter (Signed)
Pt called in and left a message with the access nurse on 07/23/22. He needs a refill of his memantine that needs to be sent to the mail order pharmacy. He has 10 left.

## 2022-07-26 ENCOUNTER — Ambulatory Visit: Payer: Medicare HMO | Admitting: Psychology

## 2022-07-26 DIAGNOSIS — F4321 Adjustment disorder with depressed mood: Secondary | ICD-10-CM | POA: Diagnosis not present

## 2022-07-26 NOTE — Telephone Encounter (Signed)
Sent to sara to sign off, due to confidential chart

## 2022-07-26 NOTE — Progress Notes (Signed)
[facility] Counselor Initial Adult Exam  Name: Peter Best Date: 07/26/2022 MRN: [id number] DOB: 1940/08/24 PCP: [name], MD  Time spent: 45 mins  Guardian/Payee:  Pt   Paperwork requested: No   Reason for Visit /Presenting Problem: Pt presents for session, via Caregility video, granting consent for the session.  Pt states he is in his room with no one else present.  I shared with pt that I am in my office with no one else here.  Pt prefers to be called "Peter Best."  Mental Status Exam: Appearance:   Casual     Behavior:  Appropriate  Motor:  Normal  Speech/Language:   Clear and Coherent  Affect:  Appropriate  Mood:  normal  Thought process:  normal  Thought content:    WNL  Sensory/Perceptual disturbances:    WNL  Orientation:  oriented to person, place, and time/date  Attention:  Good  Concentration:  Good  Memory:  WNL  Fund of knowledge:   Good  Insight:    Good  Judgment:   Good  Impulse Control:  Good   Reported Symptoms:  Pt has been diagnosed with mild cognitive impairment and that is frustrating for pt. Pt is anxious about being at [facility] (7 yrs); they are improving things now and it is getting a bit better.  Pt would like to move into [facility] because of their varying levels of care.    Risk Assessment: Danger to Self:  No Self-injurious Behavior: No Danger to Others: No Duty to Warn:no Physical Aggression / Violence:No  Access to Firearms a concern: No  Gang Involvement:No  Patient / guardian was educated about steps to take if suicide or homicide risk level increases between visits: n/a While future psychiatric events cannot be accurately predicted, the patient does not currently require acute inpatient psychiatric care and does not currently meet [facility] involuntary commitment criteria.  Substance Abuse History: Current substance abuse: No     Past Psychiatric History:   No previous  psychological problems have been observed; did briefly see a therapist about 35 yrs ago Outpatient Providers:see above History of Psych Hospitalization: No  Psychological Testing:  none    Abuse History:  Victim of: No.,  none    Report needed: No. Victim of Neglect:No. Perpetrator of  none   Witness / Exposure to Domestic Violence: No   Protective Services Involvement: No  Witness to [employer] Violence:  No   Family History:  Family History  Problem Relation Age of Onset   Memory loss Peter Best        possible early signs of Alzheimer's disease   Colon polyps Neg Hx    Rectal cancer Neg Hx    Stomach cancer Neg Hx     Living situation: the patient lives alone in a retirement community; fell last week at the facility while they were engaged in a recreational activity; he has had a couple of other instances where pt has injured himself again  Sexual Orientation: Straight  Relationship Status: divorced; his first wife died; last wife Peter Best) lives in [place]; they talk everyday and he loves her Name of spouse / other: Has a friend Peter Best) that he met at [place] and she is moving to another facility If a parent, number of children / ages: Peter Best (lives in [place] with his family); Peter Best (lives in [place] and is married with a son 78 yo and daughter 62 yo); Peter Best in [place], [place] with his family;  Peter Best lives in [place] with his family  Support Systems: friends family  Financial Stress:  No   Income/Employment/Disability: Product manager; worked for [employer] in [place]; 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; spent about 15 yrs in [place] and enjoyed that job  Financial planner: No   Educational History: Education: some college  Religion/Sprituality/World View: Protestant; attends [employer] when he is able; sometimes has doubts about his faith   Any cultural differences that may affect / interfere with treatment:  not applicable    Recreation/Hobbies: Amateur radio person, reading, walking, talking with people,   Stressors: Health problems   Loss of marriage and concerns about his living facility    Strengths: Supportive Relationships, Family, Friends, Warehouse manager, Spirituality, and Hopefulness  Barriers:  none noted   Legal History: Pending legal issue / charges: The patient has no significant history of legal issues. 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   Cataract    bilateral; removed   Diverticulosis of colon    Essential hypertension 10/01/2018   Fracture of radial head, right, closed 01/11/2021   Gastro-esophageal reflux disease with esophagitis, without bleeding    History of rheumatic heart disease 05/04/2021   Echo-2018 previously described moderately calcified aortic valve-mild regurgitation   History of skin cancer    nose and temple   Hyperlipidemia    Impacted cerumen of both ears 01/21/2020   Inguinal hernia    repaired by surgery   Insomnia    Kidney stones    passed stone - no surgery required   Lumbar pain 06/04/2020   Major depressive disorder in full remission    Migraine    Mild cognitive impairment with memory loss 08/24/2021   Nuclear sclerosis of both eyes 01/01/2018   Osteoarthritis    hip; middle back   Pain in left knee 07/19/2017   Pain of left thigh 07/19/2017   Posterior vitreous detachment of left eye 08/04/2021   Pure hypercholesterolemia 10/01/2018   Right bundle branch block 10/01/2018   Situational anxiety    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   COLONOSCOPY W/ POLYPECTOMY     UNCODED SURGICAL HISTORY   HERNIA REPAIR     INGUINAL HERNIA REPAIR     MOHS SURGERY  2018   x x  on nose   MOHS SURGERY     MOHS' CHEMOSURGERY PROCEDURE DATE: 09/30/2010:BCC R INFERIOR TEMPLE, UNCODED SURGICAL HISTORY   TONSILLECTOMY     WISDOM TOOTH EXTRACTION      Medications: Current Outpatient Medications  Medication Sig Dispense Refill   acetaminophen (TYLENOL) 500 MG tablet Take 1,000 mg by mouth every 6 (six) hours as needed for mild pain.     atorvastatin (LIPITOR) 20 MG tablet Take 20 mg by mouth daily.     famotidine (PEPCID) 20 MG tablet Take 20 mg by mouth 2 (two) times daily.     ibuprofen (ADVIL,MOTRIN) 400 MG tablet Take 400 mg by mouth every 6 (six) hours as needed for mild pain.  lisinopril (ZESTRIL) 20 MG tablet Take 20 mg by mouth daily.     memantine (NAMENDA) 10 MG tablet Take 1 tablet (10 mg total) by mouth 2 (two) times daily. 180 tablet 4   metoprolol succinate (TOPROL XL) 25 MG 24 hr tablet Take 0.5 tablets (12.5 mg total) by mouth daily. 15 tablet 3   omeprazole (PRILOSEC) 20 MG capsule Take 20 mg by mouth daily.     sertraline (ZOLOFT) 100 MG tablet Take 1 tablet by mouth as needed.     traZODone (DESYREL) 50 MG tablet Take 1 tablet by mouth as needed.     Current Facility-Administered Medications  Medication Dose Route Frequency Provider Last Rate Last Admin   0.9 %  sodium chloride infusion  500 mL Intravenous Once Hilarie Fredrickson, MD        Allergies  Allergen Reactions   Levonorgestrel-Ethinyl Charlott Holler Other (See Comments)    Diagnoses:  Adjustment disorder with depressed mood  Plan of Care: Encouraged pt to think about what self care activities he wants to engage in between now and our follow up session in 4 wks (08/23/22).   Karie Kirks, [facility]

## 2022-07-28 MED ORDER — MEMANTINE HCL 10 MG PO TABS: 10.0000 mg | ORAL_TABLET | Freq: Two times a day (BID) | ORAL | 1 refills | Status: DC

## 2022-07-28 NOTE — Addendum Note (Signed)
Addended by: Allean Found R on: 07/28/2022 10:01 AM   Modules accepted: Orders

## 2022-07-28 NOTE — Telephone Encounter (Signed)
Pt states that he is out of medication and would like a refill sent into the [facility] on [address]    Memantine 10 mg

## 2022-07-29 ENCOUNTER — Other Ambulatory Visit: Payer: Self-pay | Admitting: Family Medicine

## 2022-07-29 ENCOUNTER — Ambulatory Visit
Admission: RE | Admit: 2022-07-29 | Discharge: 2022-07-29 | Disposition: A | Discharge status: Home / Self Care | Payer: Medicare HMO | Source: Ambulatory Visit | Attending: Family Medicine | Admitting: Family Medicine

## 2022-07-29 DIAGNOSIS — G3184 Mild cognitive impairment, so stated: Secondary | ICD-10-CM | POA: Diagnosis not present

## 2022-07-29 DIAGNOSIS — M25552 Pain in left hip: Secondary | ICD-10-CM

## 2022-07-29 DIAGNOSIS — M5136 Other intervertebral disc degeneration, lumbar region: Secondary | ICD-10-CM | POA: Diagnosis not present

## 2022-07-29 DIAGNOSIS — Z9989 Dependence on other enabling machines and devices: Secondary | ICD-10-CM | POA: Diagnosis not present

## 2022-07-29 DIAGNOSIS — F39 Unspecified mood [affective] disorder: Secondary | ICD-10-CM | POA: Diagnosis not present

## 2022-08-24 ENCOUNTER — Encounter: Payer: Self-pay | Admitting: Physician Assistant

## 2022-08-24 ENCOUNTER — Ambulatory Visit: Payer: Medicare HMO | Admitting: Physician Assistant

## 2022-08-24 VITALS — BP 165/66 | HR 84 | Resp 20 | Ht 71.0 in | Wt 167.0 lb

## 2022-08-24 DIAGNOSIS — R4789 Other speech disturbances: Secondary | ICD-10-CM

## 2022-08-24 DIAGNOSIS — G3184 Mild cognitive impairment, so stated: Secondary | ICD-10-CM

## 2022-08-24 NOTE — Patient Instructions (Addendum)
It was a pleasure to see you today at our office.   Recommendations:  Follow up on  Dec 18 11;30 Continue speech therapy  Repeat Neuropsych in 1 year  Continue Memantine 10 mg twice daily.  Continue B12 daily     Whom to call:  Memory  decline, memory medications: Call our office [phone]   For psychiatric meds, mood meds: Please have your primary care physician manage these medications.   Counseling regarding caregiver distress, including caregiver depression, anxiety and issues regarding community resources, adult day care programs, adult living facilities, or memory care questions:   Feel free to contact Misty Lisabeth Register, Social Worker at [phone]   For assessment of decision of mental capacity and competency:  Call Dr. Erick Blinks, geriatric psychiatrist at [phone]  For guidance in geriatric dementia issues please call [facility] [phone]  For guidance regarding [facility] and if placement were needed at the facility, contact Sidney Ace, Social Worker tel: [phone]  If you have any severe symptoms of a stroke, or other severe issues such as confusion,severe chills or fever, etc call 911 or go to the ER as you may need to be evaluated further     Feel free to go to the following database for funded clinical studies conducted around the world: RankChecks.se   https://www.triadclinicaltrials.com/     RECOMMENDATIONS FOR ALL PATIENTS WITH MEMORY PROBLEMS: 1. Continue to exercise (Recommend 30 minutes of walking everyday, or 3 hours every week) 2. Increase social interactions - continue going to [employer] and enjoy social gatherings with friends and family 3. Eat healthy, avoid fried foods and eat more fruits and vegetables 4. Maintain adequate blood pressure, blood sugar, and blood cholesterol level. Reducing the risk of stroke and cardiovascular disease also helps promoting better memory. 5. Avoid  stressful situations. Live a simple life and avoid aggravations. Organize your time and prepare for the next day in anticipation. 6. Sleep well, avoid any interruptions of sleep and avoid any distractions in the bedroom that may interfere with adequate sleep quality 7. Avoid sugar, avoid sweets as there is a strong link between excessive sugar intake, diabetes, and cognitive impairment We discussed the Mediterranean diet, which has been shown to help patients reduce the risk of progressive memory disorders and reduces cardiovascular risk. This includes eating fish, eat fruits and green leafy vegetables, nuts like almonds and hazelnuts, walnuts, and also use olive oil. Avoid fast foods and fried foods as much as possible. Avoid sweets and sugar as sugar use has been linked to worsening of memory function.  There is always a concern of gradual progression of memory problems. If this is the case, then we may need to adjust level of care according to patient needs. Support, both to the patient and caregiver, should then be put into place.    The Alzheimer's Association is here all day, every day for people facing Alzheimer's disease through our free 24/7 Helpline: [phone]. The Helpline provides reliable information and support to all those who need assistance, such as individuals living with memory loss, Alzheimer's or other dementia, caregivers, health care professionals and the public.  Our highly trained and knowledgeable staff can help you with: Understanding memory loss, dementia and Alzheimer's  Medications and other treatment options  General information about aging and brain health  Skills to provide quality care and to find the best care from professionals  Legal, financial and living-arrangement decisions Our Helpline also features: Confidential care consultation provided by  master's level clinicians who can help with decision-making support, crisis assistance and education on issues  families face every day  Help in a caller's preferred language using our translation service that features more than 200 languages and dialects  Referrals to local community programs, services and ongoing support     FALL PRECAUTIONS: Be cautious when walking. Scan the area for obstacles that may increase the risk of trips and falls. When getting up in the mornings, sit up at the edge of the bed for a few minutes before getting out of bed. Consider elevating the bed at the head end to avoid drop of blood pressure when getting up. Walk always in a well-lit room (use night lights in the walls). Avoid area rugs or power cords from appliances in the middle of the walkways. Use a walker or a cane if necessary and consider physical therapy for balance exercise. Get your eyesight checked regularly.  FINANCIAL OVERSIGHT: Supervision, especially oversight when making financial decisions or transactions is also recommended.  HOME SAFETY: Consider the safety of the kitchen when operating appliances like stoves, microwave oven, and blender. Consider having supervision and share cooking responsibilities until no longer able to participate in those. Accidents with firearms and other hazards in the house should be identified and addressed as well.   ABILITY TO BE LEFT ALONE: If patient is unable to contact 911 operator, consider using LifeLine, or when the need is there, arrange for someone to stay with patients. Smoking is a fire hazard, consider supervision or cessation. Risk of wandering should be assessed by caregiver and if detected at any point, supervision and safe proof recommendations should be instituted.  MEDICATION SUPERVISION: Inability to self-administer medication needs to be constantly addressed. Implement a mechanism to ensure safe administration of the medications.   DRIVING: Regarding driving, in patients with progressive memory problems, driving will be impaired. We advise to have someone  else do the driving if trouble finding directions or if minor accidents are reported. Independent driving assessment is available to determine safety of driving.   If you are interested in the driving assessment, you can contact the following:  [facility] in [place] [phone]  [facility] [phone]  [facility] [phone] or [phone]      Mediterranean Diet A Mediterranean diet refers to food and lifestyle choices that are based on the traditions of countries located on the [employer]. This way of eating has been shown to help prevent certain conditions and improve outcomes for people who have chronic diseases, like kidney disease and heart disease. What are tips for following this plan? Lifestyle  Cook and eat meals together with your family, when possible. Drink enough fluid to keep your urine clear or pale yellow. Be physically active every day. This includes: Aerobic exercise like running or swimming. Leisure activities like gardening, walking, or housework. Get 7-8 hours of sleep each night. If recommended by your health care provider, drink red wine in moderation. This means 1 glass a day for nonpregnant women and 2 glasses a day for men. A glass of wine equals 5 oz (150 mL). Reading food labels  Check the serving size of packaged foods. For foods such as rice and pasta, the serving size refers to the amount of cooked product, not dry. Check the total fat in packaged foods. Avoid foods that have saturated fat or trans fats. Check the ingredients list for added sugars, such as corn syrup. Shopping  At the grocery store, buy most of your food from the areas near the walls of the store. This includes: Fresh fruits and vegetables (produce). Grains, beans, nuts, and seeds. Some of these may be available in unpackaged forms or large amounts (in bulk). Fresh seafood. Poultry and  eggs. Low-fat dairy products. Buy whole ingredients instead of prepackaged foods. Buy fresh fruits and vegetables in-season from local farmers markets. Buy frozen fruits and vegetables in resealable bags. If you do not have access to quality fresh seafood, buy precooked frozen shrimp or canned fish, such as tuna, salmon, or sardines. Buy small amounts of raw or cooked vegetables, salads, or olives from the deli or salad bar at your store. Stock your pantry so you always have certain foods on hand, such as olive oil, canned tuna, canned tomatoes, rice, pasta, and beans. Cooking  Cook foods with extra-virgin olive oil instead of using butter or other vegetable oils. Have meat as a side dish, and have vegetables or grains as your main dish. This means having meat in small portions or adding small amounts of meat to foods like pasta or stew. Use beans or vegetables instead of meat in common dishes like chili or lasagna. Experiment with different cooking methods. Try roasting or broiling vegetables instead of steaming or sauteing them. Add frozen vegetables to soups, stews, pasta, or rice. Add nuts or seeds for added healthy fat at each meal. You can add these to yogurt, salads, or vegetable dishes. Marinate fish or vegetables using olive oil, lemon juice, garlic, and fresh herbs. Meal planning  Plan to eat 1 vegetarian meal one day each week. Try to work up to 2 vegetarian meals, if possible. Eat seafood 2 or more times a week. Have healthy snacks readily available, such as: Vegetable sticks with hummus. Greek yogurt. Fruit and nut trail mix. Eat balanced meals throughout the week. This includes: Fruit: 2-3 servings a day Vegetables: 4-5 servings a day Low-fat dairy: 2 servings a day Fish, poultry, or lean meat: 1 serving a day Beans and legumes: 2 or more servings a week Nuts and seeds: 1-2 servings a day Whole grains: 6-8 servings a day Extra-virgin olive oil: 3-4 servings a day Limit  red meat and sweets to only a few servings a month What are my food choices? Mediterranean diet Recommended Grains: Whole-grain pasta. Brown rice. Bulgar wheat. Polenta. Couscous. Whole-wheat bread. Orpah Cobb. Vegetables: Artichokes. Beets. Broccoli. Cabbage. Carrots. Eggplant. Green beans. Chard. Kale. Spinach. Onions. Leeks. Peas. Squash. Tomatoes. Peppers. Radishes. Fruits: Apples. Apricots. Avocado. Berries. Bananas. Cherries. Dates. Figs. Grapes. Lemons. Melon. Oranges. Peaches. Plums. Pomegranate. Meats and other protein foods: Beans. Almonds. Sunflower seeds. Pine nuts. Peanuts. Cod. Salmon. Scallops. Shrimp. Tuna. Tilapia. Clams. Oysters. Eggs. Dairy: Low-fat milk. Cheese. Greek yogurt. Beverages: Water. Red wine. Herbal tea. Fats and oils: Extra virgin olive oil. Avocado oil. Grape seed oil. Sweets and desserts: [place] yogurt with honey. Baked apples. Poached pears. Trail mix. Seasoning and other foods: Basil. Cilantro. Coriander. Cumin. Mint. Parsley. Sage. Rosemary. Tarragon. Garlic. Oregano. Thyme. Pepper. Balsalmic vinegar. Tahini. Hummus. Tomato sauce. Olives. Mushrooms. Limit these Grains: Prepackaged pasta or rice dishes. Prepackaged cereal with added sugar. Vegetables: Deep fried potatoes (french fries). Fruits: Fruit canned in syrup. Meats and other protein foods: Beef. Pork. Lamb. Poultry with skin. Hot dogs. Tomasa Blase. Dairy: Ice cream. Sour cream. Whole milk. Beverages: Juice. Sugar-sweetened soft drinks. Beer. Liquor and spirits. Fats and oils: Butter. Canola oil. Vegetable oil. Beef fat (tallow). Lard. Sweets and desserts: Cookies.  Cakes. Pies. Candy. Seasoning and other foods: Mayonnaise. Premade sauces and marinades. The items listed may not be a complete list. Talk with your dietitian about what dietary choices are right for you. Summary The Mediterranean diet includes both food and lifestyle choices. Eat a variety of fresh fruits and vegetables, beans, nuts,  seeds, and whole grains. Limit the amount of red meat and sweets that you eat. Talk with your health care provider about whether it is safe for you to drink red wine in moderation. This means 1 glass a day for nonpregnant women and 2 glasses a day for men. A glass of wine equals 5 oz (150 mL). This information is not intended to replace advice given to you by your health care provider. Make sure you discuss any questions you have with your health care provider. Document Released: 10/22/2015 Document Revised: 11/24/2015 Document Reviewed: 10/22/2015 Elsevier Interactive Patient Education  2017 [employer].

## 2022-08-24 NOTE — Progress Notes (Signed)
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 Patient is on memantine 10 mg twice daily.  MMSE today is 28/30.  Memory is stable from his prior visit.  He is doing speech therapy which is very helpful to him.  He is able to participate in ADLs and drive without difficulties.  Mornings controlled and he also sees a psychotherapist.     Recommendations:   Follow up in 6  months. Repeat neuropsychological evaluation in 12 months for clarity of the diagnosis and disease trajectory Continue memantine 10 mg twice daily, side effects discussed Continue B12, B1  replenishment Continue speech therapy Recommend good control of cardiovascular risk factors Continue to control mood as per PCP, continue Zoloft Continue psychotherapy-counseling for adjustment disorder    Subjective:   This patient is here alone. Previous records as well as any outside records available were reviewed prior to todays visit.   Patient was last seen on 05/23/2022.  Last MoCA on March 2023 was 20/30.     Any changes in memory since last visit?  "I have less MCI moments ". Able to remember conversations and names of people that he knows, unless he just met someone.  He takes notes if he knows he is going to forget.  He continues reading newspapers, doing crossword puzzles and other brain activities.  He did speech therapy for learning different techniques to be able to express himself and felt that it is working. "In fact the ST has given me a lot of exercises, including NYT games" he likes participating in the activities provided at [facility]. repeats oneself?  Denies  Disoriented when walking into a room?  Patient denies   Leaving  objects in unusual places?  Patient denies   Wandering behavior?   denies   Any personality changes since last visit? "Once in a while I might become upset, angry  but I am able to control it"   Any worsening depression?  He is experiencing adjustment disorder in view of the diagnosis of MCI.  He is under the care of a counselor, which seems to be helpful. Hallucinations or paranoia?  denies  Seizures?   denies    Any sleep changes? "Yes, lately". "Before I had trouble falling asleep, so I doubled the dose and it worked"  Denies  vivid dreams, REM behavior or sleepwalking   Sleep apnea?   denies   Any hygiene concerns?   denies   Independent of bathing and dressing?  Endorsed  Does the patient needs help with medications? Patient  is in charge   Who is in charge of the finances? Patient is in charge     Any changes in appetite?  denies     Patient have trouble swallowing?  denies   Does the patient cook?  No   Any headaches?    "Once in a while, but they are diminishing in number".  Vision changes?  He has a history of myopia and astigmatism followed by ophthalmology. Chronic back pain "use heating and cooling pads in his back. Going to go to [facility] " Ambulates with difficulty?  He enjoys walking at least 1 mile or more, and he is trying to stay active.   Recent falls or  head injuries?    denies     Unilateral weakness, numbness or tingling?   denies   Any tremors?  denies   Any anosmia?    denies   Any incontinence of urine? Periodically, more often than before  Any bowel dysfunction?  Intermittent stool incontinence     Patient lives at [facility] and states. Does the patient drive?  He continues to drive without difficulty, denies getting lost      Neuropsychological evaluation, Dr.Merz August 2023 Briefly, results suggested fairly isolated impairments surrounding cognitive flexibility and delayed retrieval of a previously learned list of words. A relative weakness was noted across  expressive language in general, especially semantic fluency. Variability was noted across visuospatial abilities. The etiology for ongoing dysfunction is unclear at the present time. During interview, [facility] described his most salient concerns surrounding word finding and expressive language. Broadly, this domain did exhibit the most consistent weakness across testing. When considering neurodegenerative illnesses, a primary progressive aphasia (PPA) presentation could be considered as initial language dysfunction is the expectation within this illness. While [facility] does not display characteristics of the non-fluent and semantic subtypes, patterns across testing could align with the logopenic subtype. Namely, weaknesses surrounding sentence repetition, single word retrieval (list learning task), verbal fluency, and confrontation naming, would align with very early presentations of this illness. His age would be somewhat advanced relative to a typical age of onset for this illness. However, I feel this should remain under consideration as a plausible explanation for day-to-day decline and objective weakness. Regarding concerns for Alzheimer's disease, [facility] does not exhibit a pattern typical of this illness at the present time. While he was fully amnestic (i.e., 0% retention) across a list learning task, he performed well across story and figure memory tasks, demonstrating intact memory storage and retrieval abilities. It is worth highlighting that logopenic PPA presentations most often share pathological changes with Alzheimer's disease, making the outward expression of these illnesses quite similar in many cases. Continued medical monitoring will be important moving forward.        Initial visit March 2023 the patient is seen in neurologic consultation at the request of [facility] for the evaluation of memory.  The patient is here alone.  This is a 82 y.o. year old RH  male who has had  memory issues for about 2 years, worse over the last 4 or 5 months.  He states that his main complaint is recalling a word, "I cannot pull it out of the bag right away, and the neighbor told me that he may be a normal aging or not ".  He states that he is more conscious about his memory issues since he turned 82 years old.  He denies repeating himself or being disoriented when walking into her room or leaving objects in unusual places.  He ambulates without difficulty around the community, continues to walk and uses an exercise machine.  He lives in [place] and he is originally from [place], he moved here upon retirement for more tranquil life.  He lives alone but he has a girlfriend.  He denies any falls other than one in October 2022 when he fractured his right elbow which is "still healing ". Last head injury was about 40 years ago in [place] when he fell on ice and hit the back of his head on asphalt without LOC.  His mood is overall good, without irritability.  He has a history of depression, well controlled  with sertraline.  He likes to do word finding, he does not enjoy much board games or other activities.  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  He has intermittent years of insomnia, denies vivid dreams or sleepwalking, hallucinations or paranoia.  He does not feel rested, he reports that his level of energy is decreased, at times he feels sleepy.  His appetite is fair, denies trouble swallowing, he does not cook.  He denies any recent headaches, but he has a history of migraines, periodically he has an event, without aura, and is treated with Tylenol.  He denies double vision, dizziness, focal numbness or tingling, unilateral weakness, tremors or anosmia.  No history of  seizures.  He denies urine incontinence, but he does not like to drink enough water, because he does not to urinate that frequently.  He denies constipation.  He has on and off issues with diarrhea.  He denies sleep apnea, alcohol, or tobacco history.  Family history for dementia in his father (unknown type)   Labs December 2022 shows triglycerides 231, total cholesterol 172, LDL 97,   No Known Allergies   MRI brain 4/20231. No acute intracranial abnormality.2. Advanced cerebral atrophy.3. Left cerebellopontine angle lipoma.   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   Cataract    bilateral; removed   Diverticulosis of colon    Essential hypertension 10/01/2018   Fracture of radial head, right, closed 01/11/2021   Gastro-esophageal reflux disease with esophagitis, without bleeding    History of rheumatic heart disease 05/04/2021   Echo-2018 previously described moderately calcified aortic valve-mild regurgitation   History of skin cancer    nose and temple   Hyperlipidemia    Impacted cerumen of both ears 01/21/2020   Inguinal hernia    repaired by surgery   Insomnia    Kidney stones    passed stone - no surgery required   Lumbar pain 06/04/2020   Major depressive disorder in full remission    Migraine    Mild cognitive impairment with memory loss 08/24/2021   Nuclear sclerosis of both eyes 01/01/2018   Osteoarthritis    hip; middle back   Pain in left knee 07/19/2017   Pain of left thigh 07/19/2017   Posterior vitreous detachment of left eye 08/04/2021   Pure hypercholesterolemia 10/01/2018   Right bundle branch block 10/01/2018   Situational anxiety    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   COLONOSCOPY W/ POLYPECTOMY     UNCODED SURGICAL HISTORY   HERNIA REPAIR     INGUINAL HERNIA REPAIR     MOHS SURGERY  2018   x x  on nose   MOHS SURGERY     MOHS' CHEMOSURGERY PROCEDURE DATE: 09/30/2010:BCC R INFERIOR TEMPLE, UNCODED SURGICAL HISTORY   TONSILLECTOMY     WISDOM TOOTH EXTRACTION       PREVIOUS MEDICATIONS:   CURRENT MEDICATIONS:  Outpatient  Encounter Medications as of 08/24/2022  Medication Sig   acetaminophen (TYLENOL) 500 MG tablet Take 1,000 mg by mouth every 6 (six) hours as needed for mild pain.   atorvastatin (LIPITOR) 20 MG tablet Take 20 mg by mouth daily.   famotidine (PEPCID) 20 MG tablet Take 20 mg by mouth 2 (two) times daily.   ibuprofen (ADVIL,MOTRIN) 400 MG tablet Take 400 mg by mouth every 6 (six) hours as needed for mild pain.   lisinopril (ZESTRIL) 20 MG tablet Take 20 mg by mouth daily.   memantine (NAMENDA) 10 MG tablet Take 1 tablet (10 mg total) by mouth 2 (two) times daily.   metoprolol succinate (TOPROL XL) 25 MG 24 hr tablet Take 0.5 tablets (12.5 mg total) by mouth daily.   omeprazole (PRILOSEC) 20 MG capsule Take 20 mg by mouth daily.   sertraline (ZOLOFT) 100 MG tablet Take 1 tablet by mouth as needed.   traZODone (DESYREL) 50 MG tablet Take 1 tablet by mouth as needed.   Facility-Administered Encounter Medications as of 08/24/2022  Medication   0.9 %  sodium chloride infusion     Objective:     PHYSICAL EXAMINATION:    VITALS:   Vitals:   08/24/22 1103 08/24/22 1217  BP: (!) 154/66 (!) 165/66  Pulse: 84   Resp: 20   SpO2: 98%   Weight: 167 lb (75.8 kg)   Height: 5\' 11"  (1.803 m)     GEN:  The patient appears stated age and is in NAD. HEENT:  Normocephalic, atraumatic.   Neurological examination:  General: NAD, well-groomed, appears stated age. Orientation: The patient is alert. Oriented to person, place and date.  Season is "fall". Cranial nerves: There  is good facial symmetry.The speech is fluent and clear. No aphasia or dysarthria. Fund of knowledge is appropriate. Recent memory impaired and remote memory is normal.  Attention and concentration are normal.  Able to name objects and repeat phrases.  Hearing is intact to conversational tone .   Delayed recall 3/3 Sensation: Sensation is intact to light touch throughout Motor: Strength is at least antigravity x4. DTR's 2/4 in UE/LE      05/21/2021    1:00 PM  Montreal Cognitive Assessment   Visuospatial/ Executive (0/5) 1  Naming (0/3) 3  Attention: Read list of digits (0/2) 2  Attention: Read list of letters (0/1) 1  Attention: Serial 7 subtraction starting at 100 (0/3) 2  Language: Repeat phrase (0/2) 2  Language : Fluency (0/1) 1  Abstraction (0/2) 1  Delayed Recall (0/5) 1  Orientation (0/6) 6  Total 20       08/24/2022   12:00 PM  MMSE - Mini Mental State Exam  Orientation to time 4  Orientation to Place 5  Registration 3  Attention/ Calculation 5  Recall 3  Language- name 2 objects 2  Language- repeat 1  Language- follow 3 step command 3  Language- read & follow direction 1  Write a sentence 1  Copy design 0  Total score 28       Movement examination: Tone: There is normal tone in the UE/LE Abnormal movements:  no tremor.  No myoclonus.  No asterixis.   Coordination:  There is no decremation with RAM's. Normal finger to nose  Gait and Station: The patient has no difficulty arising out of a deep-seated chair without the use of the hands. The patient's stride length is good.  Mild imbalance with heel-to-toe.  Gait is cautious and narrow.  Thank you for allowing [place] the opportunity to participate in the care of this nice patient. Please do not hesitate to contact us for any questions or concerns.   Total time spent on today's visit was 35 minutes dedicated to this patient today, preparing to see patient, examining the patient, ordering tests and/or medications and  counseling the patient, documenting clinical information in the EHR or other health record, independently interpreting results and communicating results to the patient/family, discussing treatment and goals, answering patient's questions and coordinating care.  Cc:  [name], Best  Marlowe Kays 08/24/2022 12:39 PM

## 2022-08-30 ENCOUNTER — Ambulatory Visit: Payer: Medicare HMO | Admitting: Psychology

## 2022-08-30 DIAGNOSIS — R159 Full incontinence of feces: Secondary | ICD-10-CM | POA: Diagnosis not present

## 2022-08-30 DIAGNOSIS — M549 Dorsalgia, unspecified: Secondary | ICD-10-CM | POA: Diagnosis not present

## 2022-08-30 DIAGNOSIS — R399 Unspecified symptoms and signs involving the genitourinary system: Secondary | ICD-10-CM | POA: Diagnosis not present

## 2022-09-02 ENCOUNTER — Ambulatory Visit: Payer: Medicare HMO | Admitting: Physician Assistant

## 2022-09-02 ENCOUNTER — Other Ambulatory Visit (INDEPENDENT_AMBULATORY_CARE_PROVIDER_SITE_OTHER): Payer: Medicare HMO

## 2022-09-02 ENCOUNTER — Encounter: Payer: Self-pay | Admitting: Physician Assistant

## 2022-09-02 DIAGNOSIS — M79605 Pain in left leg: Secondary | ICD-10-CM

## 2022-09-02 DIAGNOSIS — S32040A Wedge compression fracture of fourth lumbar vertebra, initial encounter for closed fracture: Secondary | ICD-10-CM | POA: Diagnosis not present

## 2022-09-02 NOTE — Progress Notes (Signed)
Office Visit Note   Patient: [facility]           Date of Birth: 06-11-1940           MRN: [id number] Visit Date: 09/02/2022              Requested by: [name], MD [address] [place],  [place] [address] PCP: [name], MD   Assessment & Plan: Visit Diagnoses:  1. Pain in left leg   2. Compression fracture of L4 vertebra, initial encounter [facility])     Plan: Patient is a pleasant 82 year old gentleman who is 5 weeks status post falling while playing volleyball onto his left side.  He also relates about a week later his left leg giving way when he was going out to place the mail.  At the time of his initial injury he was evaluated with hip x-rays which show some degenerative changes but no fracture.  He said he had quite a bit of ecchymosis running down the left side into his left buttock.  He comes in today because he still has some low back pain with pain in his posterior buttock that wraps around to the lateral side of his leg.  X-rays were reviewed and did not show any acute fracture.  His hip exam today is benign he has very good motion.  He is neurologically intact he has equal strength and sensation is intact.  Could not reproduce his painful symptoms today.  X-ray is concerning however as it shows a significant compression fracture.  I have nothing to compare this to.  I did discuss his case with Dr. Shon Baton.  We will get a stat MRI.  Will then get a stat referral to Dr. Christell Constant if he has any changing symptoms and I explained saddle paresthesia.  He has not had an episode of his leg giving way since 1 week after the initial accident.  If he has any more any further loss of bowel or bladder control he is to contact us immediately or go to the nearest emergency room.  Follow-Up Instructions: Return After MRI with Dr. Christell Constant.   Orders:  Orders Placed This Encounter  Procedures   XR Lumbar Spine 2-3 Views   MR Lumbar Spine w/o contrast   No orders of the  defined types were placed in this encounter.     Procedures: No procedures performed   Clinical Data: No additional findings.   Subjective: Chief Complaint  Patient presents with   Left Leg - Pain    HPI pleasant 82 year old gentleman who is 5 weeks s/p falling after playing volleyball.  He fell on his left side and had quite a bit of ecchymosis.  He was seen and evaluated and hip x-rays did not demonstrate any fracture.  He presents today because he has had continued soreness in his back and down his posterior left buttock to the lateral thigh.  He had 1 episode 1 week after the accident of his leg giving way however has not had any since.  No previous history of back pain.  He says he has noticed some incontinence in the last week but no other loss of bowel or bladder control no paresthesias  Review of Systems  All other systems reviewed and are negative.    Objective: Vital Signs: There were no vitals taken for this visit.  Physical Exam Constitutional:      Appearance: Normal appearance.  Pulmonary:     Effort: Pulmonary effort  is normal.  Skin:    General: Skin is warm and dry.  Neurological:     General: No focal deficit present.     Mental Status: He is alert.     Ortho Exam Examination walks with a fairly normal gait.  He has some slight soreness to palpation over the lumbar spine but no acute step-off.  Negative straight leg raise bilaterally.  His strength is 5 out of 5 in the lower extremity with dorsiflexion and plantarflexion of his ankle extension and flexion of his legs and flexion of his hip.  Range of motion of his hip does not produce any pain and is actually quite fluid.  No paresthesias Specialty Comments:  No specialty comments available.  Imaging: XR Lumbar Spine 2-3 Views  Result Date: 09/02/2022 2 views of his lumbar spine were obtained today.  He has facet arthropathy and some degenerative changes but most noted is complete compression of the  L4 vertebrae.  Cannot tell if this is acute or chronic.  Recommended an MRI for further workup.  Also calcification within the aorta    PMFS History: Patient Active Problem List   Diagnosis Date Noted   Compression fracture of fourth lumbar vertebra (HCC) 09/02/2022   Situational anxiety    Benign prostatic hyperplasia with lower urinary tract symptoms    Diverticulosis of colon    Gastro-esophageal reflux disease with esophagitis, without bleeding    Major depressive disorder in full remission    Trigger thumb, right thumb 09/28/2021   Mild cognitive impairment with memory loss 08/24/2021   Vitreous membranes and strands 08/04/2021   Posterior vitreous detachment of left eye 08/04/2021   History of rheumatic heart disease 05/04/2021   Lumbar pain 06/04/2020   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   Right bundle branch block 10/01/2018   Essential hypertension 10/01/2018   Pure hypercholesterolemia 10/01/2018   Nuclear sclerosis of both eyes 01/01/2018   Pain in left knee 07/19/2017   Pain of left thigh 07/19/2017   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   Cataract    bilateral; removed   Diverticulosis of colon    Essential hypertension 10/01/2018   Fracture of radial head, right, closed 01/11/2021   Gastro-esophageal reflux disease with esophagitis, without bleeding    History of rheumatic heart disease 05/04/2021   Echo-2018 previously described moderately calcified aortic valve-mild regurgitation   History of skin cancer    nose and temple   Hyperlipidemia    Impacted cerumen of both ears 01/21/2020   Inguinal hernia    repaired by surgery   Insomnia    Kidney stones    passed stone - no surgery required   Lumbar pain 06/04/2020   Major depressive disorder in full remission     Migraine    Mild cognitive impairment with memory loss 08/24/2021   Nuclear sclerosis of both eyes 01/01/2018   Osteoarthritis    hip; middle back   Pain in left knee 07/19/2017   Pain of left thigh 07/19/2017   Posterior vitreous detachment of left eye 08/04/2021   Pure hypercholesterolemia 10/01/2018   Right bundle branch block 10/01/2018   Situational anxiety    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   COLONOSCOPY W/ POLYPECTOMY     UNCODED SURGICAL HISTORY   HERNIA REPAIR     INGUINAL HERNIA REPAIR     MOHS SURGERY  2018   x x  on nose   MOHS SURGERY     MOHS' CHEMOSURGERY PROCEDURE DATE: 09/30/2010:BCC R INFERIOR TEMPLE, UNCODED SURGICAL HISTORY   TONSILLECTOMY     WISDOM TOOTH EXTRACTION     Social History   Occupational History   Occupation: Retired    Comment: Radio broadcast assistant  Tobacco Use   Smoking status: Never    Passive exposure: Never   Smokeless tobacco: Never  Vaping Use   Vaping Use: Never used  Substance and Sexual Activity   Alcohol use: Never   Drug use: No   Sexual activity: Not on file

## 2022-09-05 ENCOUNTER — Telehealth: Payer: Self-pay

## 2022-09-05 ENCOUNTER — Telehealth: Payer: Self-pay | Admitting: Physician Assistant

## 2022-09-05 NOTE — Telephone Encounter (Signed)
No earlier appt available. Will hold in case of cancellation.

## 2022-09-05 NOTE — Telephone Encounter (Signed)
Patient called asked if Macon Large would call him concerning his hip and back. Patient asked what is the healing time his hip. Patient advised he have had a lot of falls over the years and he forgot to tell her about. Patient wondered whether the falls were a result of what he feeling now. Patient said his hip and back are sore when he walk and  sitting. The number to contact patient is [phone]

## 2022-09-05 NOTE — Telephone Encounter (Signed)
Patient called into the office seeing if he can get into the office sooner than 7/10 due to him having an increased amount of pain in his back.

## 2022-09-06 ENCOUNTER — Ambulatory Visit
Admission: RE | Admit: 2022-09-06 | Discharge: 2022-09-06 | Disposition: A | Discharge status: Home / Self Care | Payer: Medicare HMO | Source: Ambulatory Visit | Attending: Physician Assistant | Admitting: Physician Assistant

## 2022-09-06 ENCOUNTER — Telehealth: Payer: Self-pay | Admitting: Physician Assistant

## 2022-09-06 DIAGNOSIS — S32040A Wedge compression fracture of fourth lumbar vertebra, initial encounter for closed fracture: Secondary | ICD-10-CM

## 2022-09-06 DIAGNOSIS — M48061 Spinal stenosis, lumbar region without neurogenic claudication: Secondary | ICD-10-CM | POA: Diagnosis not present

## 2022-09-06 NOTE — Telephone Encounter (Signed)
Patient called advised he is having the MRI done today at 12:20 pm.   [phone]

## 2022-09-07 ENCOUNTER — Telehealth: Payer: Self-pay | Admitting: Physician Assistant

## 2022-09-07 NOTE — Telephone Encounter (Signed)
Pt states he think he received a call back from [facility]. Please call pt at [phone]

## 2022-09-07 NOTE — Telephone Encounter (Signed)
Pt has questions about AVS and what [place] dictated, he would like a call please advise

## 2022-09-14 NOTE — Telephone Encounter (Signed)
I called and offered him an appointment on 09/19/22 @930 . He is not sure if the person that wants to come with him to the appt will be available but he will call back if they can not make Mondays appt, and they are also scheduled for 09/21/22 in case they cannot make the Monday appt

## 2022-09-19 ENCOUNTER — Ambulatory Visit: Payer: Medicare HMO | Admitting: Orthopedic Surgery

## 2022-09-19 ENCOUNTER — Encounter (HOSPITAL_COMMUNITY): Payer: Self-pay | Admitting: Nurse Practitioner

## 2022-09-19 ENCOUNTER — Telehealth: Payer: Self-pay | Admitting: *Deleted

## 2022-09-19 ENCOUNTER — Telehealth: Payer: Self-pay | Admitting: Cardiology

## 2022-09-19 ENCOUNTER — Encounter: Payer: Self-pay | Admitting: Orthopedic Surgery

## 2022-09-19 VITALS — BP 154/84 | HR 85 | Ht 71.0 in | Wt 167.0 lb

## 2022-09-19 DIAGNOSIS — R079 Chest pain, unspecified: Secondary | ICD-10-CM

## 2022-09-19 DIAGNOSIS — M810 Age-related osteoporosis without current pathological fracture: Secondary | ICD-10-CM

## 2022-09-19 DIAGNOSIS — I4510 Unspecified right bundle-branch block: Secondary | ICD-10-CM

## 2022-09-19 MED ORDER — ALENDRONATE SODIUM 70 MG PO TABS: 70.0000 mg | ORAL_TABLET | ORAL | 3 refills | Status: DC

## 2022-09-19 NOTE — Telephone Encounter (Signed)
Pt c/o medication issue:  1. Name of Medication: metoprolol succinate (TOPROL XL) 25 MG 24 hr tablet   2. How are you currently taking this medication (dosage and times per day)? Take 0.5 tablets (12.5 mg total) by mouth daily.   3. Are you having a reaction (difficulty breathing--STAT)? No  4. What is your medication issue? Pt would like to know how many days he is supposed to take the above medication. Pt is also wondering when he should start the medication. Please advise.

## 2022-09-19 NOTE — Telephone Encounter (Signed)
Pt was ordered to have a GXT.  Due to right bundle branch block and HX frequent falls this has been changed to [facility] to further evaluate his chest pain. Pt was called and a message left on his voicemail per Andee Lineman to  call back with any questions and to be scheduled.  Instructions to be sent to pt via MyChart.

## 2022-09-19 NOTE — Telephone Encounter (Signed)
Peter Best was originally scheduled in January 2024 for a [facility] for exertional chest pain.  Patient cancelled test and called back in April to reschedule, stating he would prefer to walk on treadmill instead of having stress test with medication.  A new order had been placed for a regular stress test. On reviewing chart for stress test it was found the patient has a RBBB with rSr in several leads making determining ischemic ST depression in those leads impossible.  Furthermore it was found the patient had a fall in June 2024 , with subsequent ecchymosis down his left leg and back pain.  He also had an episode of his leg giving away within 1 week of the accident. He had an MRI and was diagnosed with a compression fracture of L4.  He had made a comment regarding several falls he has had in the past. This information was reviewed with Dr. Anne Fu nurse and it was felt to be safer and more diagnostic to proceed with the Lexiscan Myoview instead of a POET. Requested patient to call office to reschedule [facility]  Dr. Anne Fu nurse with re-enter a new order if necessary to reschedule.

## 2022-09-19 NOTE — Progress Notes (Signed)
Orthopedic Spine Surgery Office Note  Assessment: Patient is a 82 y.o. male with L4 burst fracture with retropulsion. 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   Plan: -Explained that most fractures go on to heal without operative intervention. His is a burst fracture extends up to the anterior aspect of the left pedicle. He has been ambulating, pain has been getting better over the last six weeks, and he is neurologically intact, so discussed continued non-operative treatment -Patient has tried tylenol, ibuprofen heating pads  -Out of bed as tolerated, no brace.  No bending/lifting/twisting greater than 10 pounds -Recommended use of 1000 g of Tylenol 3 times daily and ibuprofen as needed for flares -Discussed the fact that a compression fracture from a standing height gives him a diagnosis of osteoporosis. Will plan to get vitamin D and calcium labs and start alendronate.  -Patient should return to office in 4-6 weeks, x-rays at next visit: AP/lateral lumbar   Patient expressed understanding of the plan and all questions were answered to the patient's satisfaction.   ___________________________________________________________________________   History:  Patient is a 82 y.o. male who presents today for lumbar spine. Patient had a ground level fall about 5-6 weeks ago and noticed acute onset of low back pain. Also, had pain radiating into his left buttock and lateral thigh. The left thigh pain has resolved, but still is having some left buttock pain. No pain on the right side. Pain has been getting better with time.  He is taking less medication and has been able to do more recently.   Weakness: Denies Symptoms of imbalance: Denies Paresthesias and numbness: Denies Bowel or bladder incontinence: Has some post void dibbling, but no incontinence. No loss of bowel function Saddle anesthesia: Denies  Treatments tried: Heating pad, ibuprofen, Tylenol  Review of systems: Denies  fevers and chills, night sweats, unexplained weight loss, history of cancer, pain that wakes them at night  Past medical history: BPH Diverticulosis HTN GERD HLD Depression Right BBB TMJ disorder Atrial fibrillation  Allergies: levonorgestrel-ethinyl estrad  Past surgical history:  Polypectomy Hernia repair Nose MOHS surgery Tonsillectomy  Social history: Denies use of nicotine product (smoking, vaping, patches, smokeless) Alcohol use: denies Denies recreational drug use   Physical Exam:  BMI of 23.3  General: no acute distress, appears stated age, ambulating without assistive devices Neurologic: alert, answering questions appropriately, following commands Respiratory: unlabored breathing on room air, symmetric chest rise Psychiatric: appropriate affect, normal cadence to speech   MSK (spine):  -Strength exam      Left  Right EHL    5/5  5/5 TA    5/5  5/5 GSC    5/5  5/5 Knee extension  5/5  5/5 Hip flexion   5/5  5/5  -Sensory exam    Sensation intact to light touch in L3-S1 nerve distributions of bilateral lower extremities  -Achilles DTR: 2/4 on the left, 2/4 on the right -Patellar tendon DTR: 2/4 on the left, 2/4 on the right  -Straight leg raise: negative bilaterally -Femoral nerve stretch test: negative bilaterally -Clonus: no beats bilaterally  -Left hip exam: no pain through range of motion, negative fadair, negative faber -Right hip exam: no pain through range of motion, negative fadair, negative faber  Imaging: XR of the lumbar spine from 09/02/2022 was independently reviewed and interpreted, showing L4 fracture with significant anterior height loss. No other fractures seen. Disc height loss at L5/S1.   MRI of the lumbar spine from 09/06/2022 was  independently reviewed and interpreted, showing moderate central stenosis at L2/3. Burst fracture with retropulsion causing central stenosis posterior to the body of L3 and L4. Foraminal stenosis  bilaterally at L5/S1.    Patient name: Peter Best Patient MRN: [id number] Date of visit: 09/19/22

## 2022-09-19 NOTE — Telephone Encounter (Signed)
Patient's last visit in January. He was supposed to start metoprolol XL 25 mg. He have not started taking it. He would like to know if you would like him to start.

## 2022-09-20 LAB — CALCIUM: Calcium: 9.9 mg/dL (ref 8.6–10.3)

## 2022-09-20 LAB — EXTRA LAV TOP TUBE

## 2022-09-20 LAB — VITAMIN D 25 HYDROXY (VIT D DEFICIENCY, FRACTURES): Vit D, 25-Hydroxy: 39 ng/mL (ref 30–100)

## 2022-09-21 ENCOUNTER — Ambulatory Visit: Payer: Medicare HMO | Admitting: Orthopedic Surgery

## 2022-09-21 NOTE — Telephone Encounter (Signed)
Left voicemail for patient to return call to office. 

## 2022-09-21 NOTE — Telephone Encounter (Signed)
Patient given detailed instructions per Myocardial Perfusion Study Information Sheet for the test on 09/26/2022 at 12:45. Patient notified to arrive 15 minutes early and that it is imperative to arrive on time for appointment to keep from having the test rescheduled.  If you need to cancel or reschedule your appointment, please call the office within 24 hours of your appointment. . Patient verbalized understanding.Daneil Dolin

## 2022-09-21 NOTE — Telephone Encounter (Signed)
Left voicemail to return call to office.

## 2022-09-22 ENCOUNTER — Telehealth: Payer: Self-pay | Admitting: Nurse Practitioner

## 2022-09-22 ENCOUNTER — Telehealth: Payer: Self-pay | Admitting: Physician Assistant

## 2022-09-22 NOTE — Telephone Encounter (Signed)
Follow Up:      Patient is returning a call from yesterday, concerning his medicine.

## 2022-09-22 NOTE — Telephone Encounter (Signed)
I left detailed message on machine to call if any questions.

## 2022-09-22 NOTE — Telephone Encounter (Signed)
Reviewed Providers instructions for metoprolol: Please advise patient to discontinue Toprol-XL.  This medication was started due to chest pain and can be discontinued and removed from his med list.   Robin Searing, NP   Pt expresses will show up for 7/15 testing.  No further questions at this time. Metoprolol discontinued from medication list.

## 2022-09-22 NOTE — Telephone Encounter (Signed)
No orders have been placed from [facility], will call and notify.

## 2022-09-22 NOTE — Telephone Encounter (Signed)
Pt returning nurses call from yesterday. Pt called earlier this morning but pt called back again trying to reach out again. Please advise.

## 2022-09-22 NOTE — Telephone Encounter (Signed)
Pt is calling in stating that he rec'd a call on yesterday from [facility] and he wanting to know the reason for the the MRI and if [facility] ordered it b/c he did not get a call from the office about it.  Pt would like to have a call back as soon as possible.

## 2022-09-26 ENCOUNTER — Ambulatory Visit (HOSPITAL_COMMUNITY): Payer: Medicare HMO | Attending: Internal Medicine

## 2022-09-26 DIAGNOSIS — I4510 Unspecified right bundle-branch block: Secondary | ICD-10-CM | POA: Diagnosis not present

## 2022-09-26 DIAGNOSIS — R079 Chest pain, unspecified: Secondary | ICD-10-CM | POA: Diagnosis not present

## 2022-09-26 LAB — MYOCARDIAL PERFUSION IMAGING
LV dias vol: 54 mL (ref 62–150)
LV sys vol: 20 mL
Nuc Stress EF: 63 %
Peak HR: 104 {beats}/min
Rest HR: 85 {beats}/min
Rest Nuclear Isotope Dose: 9.3 mCi
SDS: 0
SRS: 0
SSS: 0
ST Depression (mm): 0 mm
Stress Nuclear Isotope Dose: 31.7 mCi
TID: 4.9

## 2022-09-26 MED ORDER — TECHNETIUM TC 99M TETROFOSMIN IV KIT: 31.7000 | PACK | Freq: Once | INTRAVENOUS | Status: AC | PRN

## 2022-09-26 MED ORDER — REGADENOSON 0.4 MG/5ML IV SOLN: 0.4000 mg | Freq: Once | INTRAVENOUS | Status: AC

## 2022-09-26 MED ORDER — TECHNETIUM TC 99M TETROFOSMIN IV KIT
9.3000 | PACK | Freq: Once | INTRAVENOUS | Status: AC | PRN
  Administered 2022-09-26: 9.3 via INTRAVENOUS

## 2022-09-27 ENCOUNTER — Telehealth: Payer: Self-pay | Admitting: Orthopedic Surgery

## 2022-09-27 NOTE — Telephone Encounter (Signed)
Patient called advised he is still pain in lower back and in his thigh  muscles. Patient asked if Dr. Christell Constant will call him. Patient said the pain has gotten worse in the pass week. The number to contact patient is [phone]

## 2022-09-28 ENCOUNTER — Telehealth: Payer: Self-pay | Admitting: Orthopedic Surgery

## 2022-09-28 NOTE — Telephone Encounter (Signed)
Pt calling regarding injury to his spine pt is having more issues he has some questions, offered pt an appt and he declined said he only had a few questions for [facility]

## 2022-09-28 NOTE — Telephone Encounter (Signed)
Lmom that per Dr. Christell Constant the increase in pain in the thighs and calves is related to the stenosis in his back.

## 2022-09-29 ENCOUNTER — Telehealth: Payer: Self-pay | Admitting: Orthopedic Surgery

## 2022-09-29 ENCOUNTER — Ambulatory Visit: Payer: Medicare HMO | Admitting: Orthopedic Surgery

## 2022-09-29 DIAGNOSIS — M48062 Spinal stenosis, lumbar region with neurogenic claudication: Secondary | ICD-10-CM

## 2022-09-29 NOTE — Progress Notes (Signed)
Orthopedic Spine Surgery Office Note   Assessment: Patient is a 82 y.o. male with L4 burst fracture with retropulsion. 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     Plan: -Since his pain is well-controlled with 1000 mg of Tylenol, I told him that he can take that 3 times a day.  I also said that he can take ibuprofen for flares of pain -I explained that the leg soreness he is experiencing is due to the stenosis around the fracture.  I told him that if the pain becomes refractory to conservative treatment, then surgery could be done to address that pain -Patient has tried tylenol, ibuprofen heating pads  -He has an upcoming trip and I told him that was okay to go on the trip and get on the airplane.  He can be out of bed as tolerated without a brace.  No bending/lifting/twisting greater than 10 pounds -Patient should return to office in 4 weeks, x-rays at next visit: AP/lateral lumbar     Patient expressed understanding of the plan and all questions were answered to the patient's satisfaction.    ___________________________________________________________________________     History:   Patient is a 82 y.o. male who had previously been seen for his lumbar spine and comes in today because he has had worsening soreness in the back of his legs bilaterally.  He states that he has noticed this for the last week.  There is no new injury that occurred with the last week.  He feels the pain in his low back going into the bilateral buttock and posterior thighs.  He also feels it in the posterior aspect of the legs.  He said it is worse if he sits for a long time or particularly when standing for a long time.  He has not noticed any bowel or bladder incontinence.  Denies saddle anesthesia.  No numbness or paresthesias in either lower extremity.  He has not noticed any weakness in his legs.  He states that Tylenol 1000 mg controls his pain and essentially eliminates the pain.     Treatments  tried: Heating pad, ibuprofen, Tylenol     Physical Exam:   General: no acute distress, appears stated age, ambulating without assistive devices Neurologic: alert, answering questions appropriately, following commands Respiratory: unlabored breathing on room air, symmetric chest rise Psychiatric: appropriate affect, normal cadence to speech     MSK (spine):   -Strength exam                                                   Left                  Right EHL                              5/5                  5/5 TA                                 5/5                  5/5 GSC  5/5                  5/5 Knee extension            5/5                  5/5 Hip flexion                    5/5                  5/5   -Sensory exam                           Sensation intact to light touch in L3-S1 nerve distributions of bilateral lower extremities   -Straight leg raise: negative bilaterally -Femoral nerve stretch test: negative bilaterally -Clonus: no beats bilaterally   Imaging: XR of the lumbar spine from 09/02/2022 was previously independently reviewed and interpreted, showing L4 fracture with significant anterior height loss. No other fractures seen. Disc height loss at L5/S1.    MRI of the lumbar spine from 09/06/2022 was previously independently reviewed and interpreted, showing moderate central stenosis at L2/3. Burst fracture with retropulsion causing central stenosis posterior to the body of L3 and L4. Foraminal stenosis bilaterally at L5/S1.      Patient name: Peter Best Patient MRN: [id number] Date of visit: 09/29/22

## 2022-09-29 NOTE — Telephone Encounter (Signed)
Yes he needs an appt, I put on the schedule for today

## 2022-09-29 NOTE — Telephone Encounter (Signed)
Patient called. Is having a lot of pain and soreness in his legs. Would like to know if he should come in. He can not put much weight on his legs. Cb # [phone]

## 2022-10-04 ENCOUNTER — Telehealth: Payer: Self-pay | Admitting: Orthopedic Surgery

## 2022-10-04 ENCOUNTER — Ambulatory Visit (INDEPENDENT_AMBULATORY_CARE_PROVIDER_SITE_OTHER): Payer: Medicare HMO | Admitting: Psychology

## 2022-10-04 DIAGNOSIS — F4321 Adjustment disorder with depressed mood: Secondary | ICD-10-CM

## 2022-10-04 NOTE — Progress Notes (Signed)
[facility] Counselor/Therapist Progress Note  Patient ID: Peter Best, MRN: [id number],    Date: 10/04/2022  Time Spent: 45 mins     start time: 1100    end time: 1145  Treatment Type: Individual Therapy  Reported Symptoms: Pt presents in person in the office for the session, granting consent for the session.    Mental Status Exam: Appearance:  Casual     Behavior: Appropriate  Motor: Normal  Speech/Language:  Clear and Coherent  Affect: Appropriate  Mood: normal  Thought process: normal  Thought content:   WNL  Sensory/Perceptual disturbances:   WNL  Orientation: oriented to person, place, and time/date  Attention: Good  Concentration: Good  Memory: WNL  Fund of knowledge:  Good  Insight:   Good  Judgment:  Good  Impulse Control: Good   Risk Assessment: Danger to Self:  No Self-injurious Behavior: No Danger to Others: No Duty to Warn:no Physical Aggression / Violence:No  Access to Firearms a concern: No  Gang Involvement:No   Subjective: Pt shares that he has been OK since our last session; his back is still healing from his fall.  He is planning a trip to [place] from 8/1-8/5 to see two sons and his ex-wife and his grandchildren.  He is looking forward to the trip but is also worried a bit about making sure he is able to do everything that he will need to do to prepare and while he is there.  Pt believes he is doing pretty well managing these issues.  He also shares that he is feeling frustrated by technical issues at this time, mainly phone and computer.  Pt continues to consider his two main options for moving but feels pressure making the decisions.  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  Talked with pt about the benefits of deep breathing to help him collect his thoughts when he is having difficulty; pt practices breathing in the session and can feel the benefits of it.  Encouraged pt to continue with  his self care activities and he will call the office to schedule a follow up session after his trip to [place].  Interventions: Cognitive Behavioral Therapy  Diagnosis:Adjustment disorder with depressed mood  Plan: Treatment Plan Strengths/Abilities:  Intelligent, Intuitive, Willing to participate in therapy Treatment Preferences:  Outpatient Individual Therapy Statement of Needs:  Patient is to use CBT, mindfulness and coping skills to help manage and/or decrease symptoms associated with their diagnosis. Symptoms:  Depressed/Irritable mood, worry, social withdrawal Problems Addressed:  Depressive thoughts, Sadness, Sleep issues, etc. Long Term Goals:  Pt to reduce overall level, frequency, and intensity of the feelings of depression as evidenced by decreased irritability, negative self talk, and helpless feelings from 6 to 7 days/week to 0 to 1 days/week, per client report, for at least 3 consecutive months.  Progress: 30% Short Term Goals:  Pt to verbally express understanding of the relationship between feelings of depression and their impact on thinking patterns and behaviors.  Pt to verbalize an understanding of the role that distorted thinking plays in creating fears, excessive worry, and ruminations.  Progress: 30% Target Date:  10/04/2023 Frequency:  Bi-weekly Modality:  Cognitive Behavioral Therapy Interventions by Therapist:  Therapist will use CBT, Mindfulness exercises, Coping skills and Referrals, as needed by client. Client has verbally approved this treatment plan.  Karie Kirks, [facility]

## 2022-10-04 NOTE — Telephone Encounter (Signed)
Patient called. Says he wants to talk to Dr. Christell Constant. [phone]

## 2022-10-05 ENCOUNTER — Telehealth: Payer: Self-pay | Admitting: Orthopedic Surgery

## 2022-10-07 ENCOUNTER — Encounter: Payer: Self-pay | Admitting: Gastroenterology

## 2022-10-16 DIAGNOSIS — S32041D Stable burst fracture of fourth lumbar vertebra, subsequent encounter for fracture with routine healing: Secondary | ICD-10-CM | POA: Diagnosis not present

## 2022-10-18 ENCOUNTER — Telehealth: Payer: Self-pay | Admitting: Orthopedic Surgery

## 2022-10-18 NOTE — Telephone Encounter (Signed)
Pt would like a call to speak with Christell Constant or Nurse about his injury he would like someone to call his Mobile line on file, did not want to leave information with me, please advise

## 2022-10-18 NOTE — Telephone Encounter (Signed)
I called and lmom for pt to call me back 

## 2022-10-19 ENCOUNTER — Ambulatory Visit: Payer: Self-pay | Admitting: Orthopedic Surgery

## 2022-10-19 ENCOUNTER — Other Ambulatory Visit: Payer: Self-pay

## 2022-10-19 ENCOUNTER — Telehealth: Payer: Self-pay | Admitting: Physician Assistant

## 2022-10-19 DIAGNOSIS — M5416 Radiculopathy, lumbar region: Secondary | ICD-10-CM | POA: Diagnosis not present

## 2022-10-19 MED ORDER — MEMANTINE HCL 10 MG PO TABS: 10.0000 mg | ORAL_TABLET | Freq: Two times a day (BID) | ORAL | 1 refills | Status: AC

## 2022-10-19 NOTE — Telephone Encounter (Signed)
Pended it for sara to sign

## 2022-10-19 NOTE — Telephone Encounter (Signed)
Patient is coming in today for appt.

## 2022-10-19 NOTE — Telephone Encounter (Signed)
Pt is calling in stating that he would like to change it to Pharm[facility] and has [facility] tablets on hand.  Pt would like to have a call once it has been sent in.

## 2022-10-19 NOTE — Progress Notes (Signed)
Orthopedic Spine Surgery Office Note   Assessment: Patient is a 82 y.o. male with L4 burst fracture with retropulsion. 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     Plan: -I told him that he can continue with the Tylenol and ibuprofen.  He was prescribed gabapentin by another provider.  I told him that he can take that medication 3 times per day and if he is on the 100 mg dose that he can take an additional 200 mg at night -Recommended therapeutic ESI -If his pain does not get better with conservative treatments, would offer surgery as a treatment option in the form of laminectomy and fusion.  I told him that a lot of times though the body is able to reabsorb some of the retropulsion which would give improve his stenosis and would hopefully help with some of his radiating leg pain -Provided him with a written PT prescription since he has a PT in his building where he can do therapy -Patient has tried tylenol, ibuprofen, heating pads, gabapentin -Patient should return to office in 4 weeks, x-rays at next visit: AP/lateral lumbar     Patient expressed understanding of the plan and all questions were answered to the patient's satisfaction.    ___________________________________________________________________________     History:   Patient is a 82 y.o. male who had previously been seen for his lumbar spine and comes in today because he has had worsening of his leg pain.  He says that his back pain has been getting better but in the past week, he has noted worsening leg pain.  He was visiting family in [place] and the pain got so bad that he had to go to an urgent care.  He was prescribed gabapentin which he has found to be somewhat helpful.  Pain is now felt more in the legs.  He feels that in the buttock and the posterior thighs bilaterally.  States that sometimes radiates past the knee on the posterior aspect the leg.  He has not noticed any bowel or bladder incontinence.  He  has not had any saddle anesthesia.  He has not developed any weakness in his lower extremities.     Treatments tried: Heating pad, ibuprofen, Tylenol, gabapentin     Physical Exam:   General: no acute distress, appears stated age, ambulating without assistive devices Neurologic: alert, answering questions appropriately, following commands Respiratory: unlabored breathing on room air, symmetric chest rise Psychiatric: appropriate affect, normal cadence to speech     MSK (spine):   -Strength exam                                                   Left                  Right EHL                              5/5                  5/5 TA                                 5/5  5/5 GSC                             5/5                  5/5 Knee extension            5/5                  5/5 Hip flexion                    5/5                  5/5   -Sensory exam                           Sensation intact to light touch in L3-S1 nerve distributions of bilateral lower extremities   -Straight leg raise: negative bilaterally -Femoral nerve stretch test: negative bilaterally -Clonus: no beats bilaterally   Imaging: XR of the lumbar spine from 09/02/2022 was previously independently reviewed and interpreted, showing L4 fracture with significant anterior height loss. No other fractures seen. Disc height loss at L5/S1.    MRI of the lumbar spine from 09/06/2022 was previously independently reviewed and interpreted, showing moderate central stenosis at L2/3. Burst fracture with retropulsion causing central stenosis posterior to the body of L3 and L4. Foraminal stenosis bilaterally at L5/S1.      Patient name: Peter Best Patient MRN: [id number] Date of visit: 10/19/22

## 2022-10-19 NOTE — Telephone Encounter (Signed)
Pt is calling in stating that he is needing a refill on memantine (NAMENDA) 10 MG. [facility] [address].

## 2022-10-21 ENCOUNTER — Telehealth: Payer: Self-pay | Admitting: Orthopedic Surgery

## 2022-10-25 ENCOUNTER — Other Ambulatory Visit: Payer: Self-pay | Admitting: Radiology

## 2022-10-25 DIAGNOSIS — M79605 Pain in left leg: Secondary | ICD-10-CM

## 2022-10-25 DIAGNOSIS — M5416 Radiculopathy, lumbar region: Secondary | ICD-10-CM

## 2022-10-25 DIAGNOSIS — M48062 Spinal stenosis, lumbar region with neurogenic claudication: Secondary | ICD-10-CM

## 2022-10-26 ENCOUNTER — Encounter: Payer: Self-pay | Admitting: Gastroenterology

## 2022-10-26 ENCOUNTER — Ambulatory Visit (INDEPENDENT_AMBULATORY_CARE_PROVIDER_SITE_OTHER): Payer: Medicare HMO | Admitting: Gastroenterology

## 2022-10-26 VITALS — BP 160/90 | HR 84 | Ht 69.0 in | Wt 161.2 lb

## 2022-10-26 DIAGNOSIS — K5900 Constipation, unspecified: Secondary | ICD-10-CM | POA: Diagnosis not present

## 2022-10-26 DIAGNOSIS — K219 Gastro-esophageal reflux disease without esophagitis: Secondary | ICD-10-CM | POA: Diagnosis not present

## 2022-10-26 DIAGNOSIS — R159 Full incontinence of feces: Secondary | ICD-10-CM | POA: Diagnosis not present

## 2022-10-26 NOTE — Patient Instructions (Addendum)
Start taking Miralax 1 capful (17 grams) 1x / day for 1 week.   If this is not effective, increase to 1 dose 2x / day for 1 week.   If this is still not effective, increase to two capfuls (34 grams) 2x / day.   Can adjust dose as needed based on response. Can take 1/2 cap daily, skip days, or increase per day.    Benefiber/Metamucil daily (follow instructions on the package). Can start with 2 teaspoons 1-3 times daily.  Purchase Training and development officer from [employer] to 60-100oz daily.  Due to recent changes in healthcare laws, you may see the results of your imaging and laboratory studies on MyChart before your provider has had a chance to review them.  We understand that in some cases there may be results that are confusing or concerning to you. Not all laboratory results come back in the same time frame and the provider may be waiting for multiple results in order to interpret others.  Please give [place] 48 hours in order for your provider to thoroughly review all the results before contacting the office for clarification of your results.   Thank you for trusting me with your gastrointestinal care!   Boone Master, PA

## 2022-10-26 NOTE — Progress Notes (Signed)
Chief Complaint: Fecal incontinence Primary GI MD: Dr. Marina Goodell  HPI: 82 year old male history of GERD, BPH, diverticulosis, RBBB, presents for evaluation of fecal incontinence.  Patient states he has had rare fecal incontinence over the last 8 months occurring about twice per month.  He will go to take his undergarments off and notice there are brown spots in his underwear where he has had some fecal leakage.  He also notices some associated increased urinary frequency.  He states due to the urinary frequency he does not drink as much water as he typically should.  He reports most bowel movements he does have to strain with.  Denies melena/hematochezia.  Denies abdominal pain.  Reports rare/occasional GERD for which he takes Tums with adequate relief.  GERD is typically worse after his dinner around 7 to 8 PM.  PREVIOUS GI WORKUP   Colonoscopy 12/2017 with Dr. 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   Cataract    bilateral; removed   Diverticulosis of colon    Essential hypertension 10/01/2018   Fracture of radial head, right, closed 01/11/2021   Gastro-esophageal reflux disease with esophagitis, without bleeding    History of rheumatic heart disease 05/04/2021   Echo-2018 previously described moderately calcified aortic valve-mild regurgitation   History of skin cancer    nose and temple   Hyperlipidemia    Impacted cerumen of both ears 01/21/2020   Inguinal hernia    repaired by surgery   Insomnia    Kidney stones    passed stone - no surgery required   Lumbar pain 06/04/2020   Major depressive disorder in full remission    Migraine    Mild  cognitive impairment with memory loss 08/24/2021   Nuclear sclerosis of both eyes 01/01/2018   Osteoarthritis    hip; middle back   Pain in left knee 07/19/2017   Pain of left thigh 07/19/2017   Posterior vitreous detachment of left eye 08/04/2021   Pure hypercholesterolemia 10/01/2018   Right bundle branch block 10/01/2018   Situational anxiety    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   COLONOSCOPY W/ POLYPECTOMY     UNCODED SURGICAL HISTORY   HERNIA REPAIR     INGUINAL HERNIA REPAIR     MOHS SURGERY  2018   x x  on nose   MOHS SURGERY     MOHS' CHEMOSURGERY PROCEDURE DATE: 09/30/2010:BCC R INFERIOR TEMPLE, UNCODED SURGICAL HISTORY   TONSILLECTOMY     WISDOM TOOTH EXTRACTION      Current Outpatient Medications  Medication Sig Dispense Refill   acetaminophen (TYLENOL) 500 MG tablet Take 1,000 mg by mouth every 6 (six) hours as needed for mild pain.     alendronate (FOSAMAX) 70 MG tablet Take 1 tablet (70 mg total) by mouth once a week. Take with a full glass of water on an empty stomach. 4 tablet 3   atorvastatin (LIPITOR) 20 MG tablet Take 20 mg by mouth daily.     famotidine (  PEPCID) 20 MG tablet Take 20 mg by mouth 2 (two) times daily.     ibuprofen (ADVIL,MOTRIN) 400 MG tablet Take 400 mg by mouth every 6 (six) hours as needed for mild pain.     lisinopril (ZESTRIL) 20 MG tablet Take 20 mg by mouth daily.     memantine (NAMENDA) 10 MG tablet Take 1 tablet (10 mg total) by mouth 2 (two) times daily. 180 tablet 1   omeprazole (PRILOSEC) 20 MG capsule Take 20 mg by mouth daily.     sertraline (ZOLOFT) 100 MG tablet Take 1 tablet by mouth as needed.     traZODone (DESYREL) 50 MG tablet Take 1 tablet by mouth as needed.     Current Facility-Administered Medications   Medication Dose Route Frequency Provider Last Rate Last Admin   0.9 %  sodium chloride infusion  500 mL Intravenous Once Hilarie Fredrickson, MD       Facility-Administered Medications Ordered in Other Visits  Medication Dose Route Frequency Provider Last Rate Last Admin   regadenoson (LEXISCAN) injection SOLN 0.4 mg  0.4 mg Intravenous Once Hilty, Lisette Abu, MD       technetium tetrofosmin (TC-MYOVIEW) injection 31.7 millicurie  31.7 millicurie Intravenous Once PRN Chrystie Nose, MD        Allergies as of 10/26/2022 - Review Complete 09/26/2022  Allergen Reaction Noted   Levonorgestrel-ethinyl estrad Other (See Comments) 02/22/2022    Family History  Problem Relation Age of Onset   Memory loss Father        possible early signs of Alzheimer's disease   Colon polyps Neg Hx    Rectal cancer Neg Hx    Stomach cancer Neg Hx     Social History   Socioeconomic History   Marital status: Divorced    Spouse name: Not on file   Number of children: Not on file   Years of education: 13   Highest education level: Some college, no degree  Occupational History   Occupation: Retired    Comment: Radio broadcast assistant  Tobacco Use   Smoking status: Never    Passive exposure: Never   Smokeless tobacco: Never  Vaping Use   Vaping status: Never Used  Substance and Sexual Activity   Alcohol use: Never   Drug use: No   Sexual activity: Not on file  Other Topics Concern   Not on file  Social History Narrative   Right handed    Retired   Drinks caffeine prn   Social Determinants of Corporate investment banker Strain: Not on file  Food Insecurity: Not on file  Transportation Needs: Not on file  Physical Activity: Not on file  Stress: Not on file  Social Connections: Not on file  Intimate Partner Violence: Not on file    Review of Systems:    Constitutional: No weight loss, fever, chills, weakness or fatigue HEENT: Eyes: No change in vision               Ears, Nose, Throat:  No  change in hearing or congestion Skin: No rash or itching Cardiovascular: No chest pain, chest pressure or palpitations   Respiratory: No SOB or cough Gastrointestinal: See HPI and otherwise negative Genitourinary: No dysuria or change in urinary frequency Neurological: No headache, dizziness or syncope Musculoskeletal: No new muscle or joint pain Hematologic: No bleeding or bruising Psychiatric: No history of depression or anxiety    Physical Exam:  Vital signs: There were no vitals taken for this  visit.  Constitutional: NAD, Well developed, Well nourished, alert and cooperative Head:  Normocephalic and atraumatic. Eyes:   PEERL, EOMI. No icterus. Conjunctiva pink. Respiratory: Respirations even and unlabored. Lungs clear to auscultation bilaterally.   No wheezes, crackles, or rhonchi.  Cardiovascular:  Regular rate and rhythm. No peripheral edema, cyanosis or pallor.  Gastrointestinal:  Soft, nondistended, nontender. No rebound or guarding. Normal bowel sounds. No appreciable masses or hepatomegaly. Rectal:  CMA Sophia as chaperone. Brown stool around rectum. Nontender, nonthrombosed, nonbleeding external hemorrhoid. Normal sphincter tone. Stool burden noted on DRE. Msk:  Symmetrical without gross deformities. Without edema, no deformity or joint abnormality.  Neurologic:  Alert and  oriented x4;  grossly normal neurologically.  Skin:   Dry and intact without significant lesions or rashes. Psychiatric: Oriented to person, place and time. Demonstrates good judgement and reason without abnormal affect or behaviors.   RELEVANT LABS AND IMAGING: CBC    Component Value Date/Time   WBC 7.5 12/05/2020 1332   RBC 4.95 12/05/2020 1332   HGB 15.4 12/05/2020 1332   HCT 46.5 12/05/2020 1332   PLT 174 12/05/2020 1332   MCV 93.9 12/05/2020 1332   MCH 31.1 12/05/2020 1332   MCHC 33.1 12/05/2020 1332   RDW 12.6 12/05/2020 1332   LYMPHSABS 1.2 12/05/2020 1332   MONOABS 0.6 12/05/2020 1332    EOSABS 0.0 12/05/2020 1332   BASOSABS 0.0 12/05/2020 1332    CMP     Component Value Date/Time   NA 139 12/05/2020 1332   K 3.8 12/05/2020 1332   CL 106 12/05/2020 1332   CO2 26 12/05/2020 1332   GLUCOSE 94 12/05/2020 1332   BUN 17 12/05/2020 1332   CREATININE 0.78 12/05/2020 1332   CALCIUM 9.9 09/19/2022 1019   PROT 6.9 12/05/2020 1332   ALBUMIN 4.4 12/05/2020 1332   AST 18 12/05/2020 1332   ALT 13 12/05/2020 1332   ALKPHOS 47 12/05/2020 1332   BILITOT 1.0 12/05/2020 1332   GFRNONAA >60 12/05/2020 1332     Assessment/Plan:   Constipation, unspecified constipation type Incontinence of feces, unspecified fecal incontinence type Rare small amount of incontinence of feces twice monthly in addition to constipation.  Suspect incontinence is due to his constipation.  Suspect constipation is occurring due to decreased water intake secondary to urinary frequency.  Patient is up-to-date on colonoscopy (2019).  Normal sphincter tone and stool burden noted on physical exam. --- Start taking Miralax 1 capful (17 grams) 1x / day for 1 week.   If this is not effective, increase to 1 dose 2x / day for 1 week.   If this is still not effective, increase to two capfuls (34 grams) 2x / day.   Can adjust dose as needed based on response. Can take 1/2 cap daily, skip days, or increase per day.  --- Benefiber/metamucil daily --- increase water to 60-100oz per day --- purchase squatty potty --- follow up prn if no improvement in symptoms.  --- if no improvement or worsening symptoms can consider diagnostic colonoscopy. Though at this time it is not indicated and risks outweigh benefits.  Gastroesophageal reflux disease without esophagitis Occasional GERD controlled with occasional Tums.  Not interested in getting on long-term medicine. --- Educated patient on lifestyle modifications and provided patient education handout --- Do not lie down 2 to 3 hours after eating --- Elevate the head of  the bed 2 to 3 inches for nighttime symptoms --- Can continue to use Tums as needed.  Though did inform  patient that excessive use of Tums can cause worsening constipation.  Hypertension Patient's blood pressure in office today is 160/90.  He reports he typically has hypertension when he is in a doctor's office.  Denies symptoms of dizziness, headache  Estell Puccini Jolee Ewing [facility] 10/26/2022, 8:49 AM  Cc: [name], MD

## 2022-10-26 NOTE — Progress Notes (Signed)
Noted  

## 2022-10-27 ENCOUNTER — Telehealth: Payer: Self-pay | Admitting: Gastroenterology

## 2022-10-27 NOTE — Telephone Encounter (Signed)
Inbound call from patient requesting a call back to speak with [facility] regarding yesterday's appointment. Patient states he has questions he would like to go over. Please advise, thank you.

## 2022-10-27 NOTE — Telephone Encounter (Signed)
Bayley, please advise.

## 2022-10-28 ENCOUNTER — Ambulatory Visit: Payer: Medicare HMO | Admitting: Orthopedic Surgery

## 2022-10-28 NOTE — Telephone Encounter (Signed)
Inbound call from patient returning phone call. Patient stated that he filled out a form and one of the categories were regarding voices. States he did not know what that meant and would like to speak further. Please advise, thank you.

## 2022-10-31 ENCOUNTER — Ambulatory Visit: Payer: Medicare HMO | Admitting: Orthopedic Surgery

## 2022-10-31 DIAGNOSIS — S32001D Stable burst fracture of unspecified lumbar vertebra, subsequent encounter for fracture with routine healing: Secondary | ICD-10-CM | POA: Diagnosis not present

## 2022-10-31 MED ORDER — GABAPENTIN 100 MG PO CAPS: 100.0000 mg | ORAL_CAPSULE | Freq: Three times a day (TID) | ORAL | 1 refills | Status: DC

## 2022-10-31 NOTE — Progress Notes (Signed)
Orthopedic Spine Surgery Office Note   Assessment: Patient is a 82 y.o. male with L4 burst fracture with retropulsion. 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     Plan: -He should continue take tylenol. He said he was running out of gabapentin so a new prescription was provided to him today. He said he has tramadol at home and I told him he can take that as needed for additional pain relief -He has been approved for a diagnostic/therapeutic ESI and we will get this done on 11/15/2022 -If his pain does not get better with conservative treatments, would offer surgery as a treatment option in the form of laminectomy and fusion.  He is 2 months out from injury and would like to avoid surgery so I told him to wait at least another month and if pain is to the point that it is tolerable then he would not need a surgery but if it is persisting and is intolerable, then surgery may provide him with some benefit -Patient has tried tylenol, ibuprofen, heating pads, gabapentin, tramadol -Patient should return to office in 4 weeks, x-rays at next visit: AP/lateral lumbar     Patient expressed understanding of the plan and all questions were answered to the patient's satisfaction.    ___________________________________________________________________________     History:   Patient is a 82 y.o. male who had previously been seen for his lumbar spine and comes in today because he is frustrated.  The has had issues with getting the injection done.  Initially, it was not can get done quick enough in our office so referral was provided to him to [facility] imaging to get the injection done.  He said no one called him from [facility] imaging and he is still having pain so he wants to get the shot done.  Pain in his back is slowly gotten better.  He is still having significant pain radiating into his legs particularly the left leg.  He feels it in the buttock and posterior thighs bilaterally.  It sometimes  radiates past the knee on the posterior aspect of the left leg.  He has not noticed it radiating past the knee on the right side as much.  No saddle anesthesia.  Has not noticed any weakness in his lower extremities.  Denies bowel or bladder incontinence.   Treatments tried: Heating pad, ibuprofen, Tylenol, gabapentin     Physical Exam:   General: no acute distress, appears stated age, ambulating without assistive devices Neurologic: alert, answering questions appropriately, following commands Respiratory: unlabored breathing on room air, symmetric chest rise Psychiatric: appropriate affect, normal cadence to speech     MSK (spine):   -Strength exam                                                   Left                  Right EHL                              5/5                  5/5 TA  5/5                  5/5 GSC                             5/5                  5/5 Knee extension            5/5                  5/5 Hip flexion                    5/5                  5/5   -Sensory exam                           Sensation intact to light touch in L3-S1 nerve distributions of bilateral lower extremities   -Straight leg raise: negative bilaterally -Femoral nerve stretch test: negative bilaterally -Clonus: no beats bilaterally   Imaging: XR of the lumbar spine from 09/02/2022 was previously independently reviewed and interpreted, showing L4 fracture with significant anterior height loss. No other fractures seen. Disc height loss at L5/S1.    MRI of the lumbar spine from 09/06/2022 was previously independently reviewed and interpreted, showing moderate central stenosis at L2/3. Burst fracture with retropulsion causing central stenosis posterior to the body of L3 and L4. Foraminal stenosis bilaterally at L5/S1.      Patient name: Peter Best Patient MRN: [id number] Date of visit: 10/31/22

## 2022-11-02 ENCOUNTER — Telehealth: Payer: Self-pay | Admitting: Gastroenterology

## 2022-11-02 NOTE — Telephone Encounter (Signed)
Inbound call from patient stating that he has misplaced his after visit summery from his visit on 8/15. Patient is requesting it to be sent in the mail. Please advise.

## 2022-11-02 NOTE — Telephone Encounter (Signed)
Contacted pt & LVM to return call 

## 2022-11-15 ENCOUNTER — Other Ambulatory Visit: Payer: Self-pay

## 2022-11-15 ENCOUNTER — Telehealth: Payer: Self-pay | Admitting: Orthopedic Surgery

## 2022-11-15 ENCOUNTER — Ambulatory Visit: Payer: Medicare HMO | Admitting: Physical Medicine and Rehabilitation

## 2022-11-15 VITALS — BP 146/82 | HR 102

## 2022-11-15 DIAGNOSIS — M5416 Radiculopathy, lumbar region: Secondary | ICD-10-CM

## 2022-11-15 MED ORDER — METHYLPREDNISOLONE ACETATE 80 MG/ML IJ SUSP
80.0000 mg | Freq: Once | INTRAMUSCULAR | Status: AC
  Administered 2022-11-15: 80 mg

## 2022-11-15 MED ORDER — TRAMADOL HCL 50 MG PO TABS: 50.0000 mg | ORAL_TABLET | Freq: Four times a day (QID) | ORAL | 0 refills | Status: AC | PRN

## 2022-11-15 NOTE — Patient Instructions (Signed)

## 2022-11-15 NOTE — Progress Notes (Addendum)
Peter Best - 82 y.o. male MRN [id number]  Date of birth: February 18, 1941  Office Visit Note: Visit Date: 11/15/2022 PCP: [name], MD Referred by: London Sheer, MD  Subjective: Chief Complaint  Patient presents with   Lower Back - Pain   HPI:  Peter Best is a 82 y.o. male who comes in today at the request of Dr. Willia Craze for planned Left L3-4 Lumbar Interlaminar epidural steroid injection with fluoroscopic guidance.  The patient has failed conservative care including home exercise, medications, time and activity modification.  This injection will be diagnostic and hopefully therapeutic.  Please see requesting physician notes for further details and justification.  **Patient had increased pain with medication delivery to the L3-4 interspace. It was back pain than any radicular pain. I was able to deliver about 2 ML of injectate.   ROS Otherwise per HPI.  Assessment & Plan: Visit Diagnoses:    ICD-10-CM   1. Lumbar radiculopathy  M54.16 XR C-ARM NO REPORT    Epidural Steroid injection    methylPREDNISolone acetate (DEPO-MEDROL) injection 80 mg      Plan: No additional findings.   Meds & Orders:  Meds ordered this encounter  Medications   methylPREDNISolone acetate (DEPO-MEDROL) injection 80 mg    Orders Placed This Encounter  Procedures   XR C-ARM NO REPORT   Epidural Steroid injection    Follow-up: Return for visit to requesting provider as needed.   Procedures: No procedures performed  Lumbar Epidural Steroid Injection - Interlaminar Approach with Fluoroscopic Guidance  Patient: Peter Best      Date of Birth: 23-Sep-1940 MRN: [id number] PCP: [name], MD      Visit Date: 11/15/2022   Universal Protocol:     Consent Given By: the patient  Position: PRONE  Additional Comments: Vital signs were monitored before and after the procedure. Patient was prepped and draped in the usual sterile fashion. The correct patient, procedure, and  site was verified.   Injection Procedure Details:   Procedure diagnoses: Lumbar radiculopathy [M54.16]   Meds Administered:  Meds ordered this encounter  Medications   methylPREDNISolone acetate (DEPO-MEDROL) injection 80 mg     Laterality: Left  Location/Site:  L3-4  Needle: 3.5 in., 20 ga. Tuohy  Needle Placement: Paramedian epidural  Findings:   -Comments: Excellent flow of contrast into the epidural space.  Procedure Details: Using a paramedian approach from the side mentioned above, the region overlying the inferior lamina was localized under fluoroscopic visualization and the soft tissues overlying this structure were infiltrated with 4 ml. of 1% Lidocaine without Epinephrine. The Tuohy needle was inserted into the epidural space using a paramedian approach.   The epidural space was localized using loss of resistance along with counter oblique bi-planar fluoroscopic views.  After negative aspirate for air, blood, and CSF, a 2 ml. volume of Isovue-250 was injected into the epidural space and the flow of contrast was observed. Radiographs were obtained for documentation purposes.    The injectate was administered into the level noted above.   Additional Comments:  No complications occurred Dressing: 2 x 2 sterile gauze and Band-Aid    Post-procedure details: Patient was observed during the procedure. Post-procedure instructions were reviewed.  Patient left the clinic in stable condition.   Clinical History: CLINICAL DATA:  82 year old male with low back pain radiating to the left lower extremity. Compression fracture.   EXAM: MRI LUMBAR SPINE WITHOUT CONTRAST   TECHNIQUE: Multiplanar, multisequence MR imaging of the lumbar spine  Peter Best - 82 y.o. male MRN [id number]  Date of birth: February 18, 1941  Office Visit Note: Visit Date: 11/15/2022 PCP: [name], MD Referred by: London Sheer, MD  Subjective: Chief Complaint  Patient presents with   Lower Back - Pain   HPI:  Peter Best is a 82 y.o. male who comes in today at the request of Dr. Willia Craze for planned Left L3-4 Lumbar Interlaminar epidural steroid injection with fluoroscopic guidance.  The patient has failed conservative care including home exercise, medications, time and activity modification.  This injection will be diagnostic and hopefully therapeutic.  Please see requesting physician notes for further details and justification.  **Patient had increased pain with medication delivery to the L3-4 interspace. It was back pain than any radicular pain. I was able to deliver about 2 ML of injectate.   ROS Otherwise per HPI.  Assessment & Plan: Visit Diagnoses:    ICD-10-CM   1. Lumbar radiculopathy  M54.16 XR C-ARM NO REPORT    Epidural Steroid injection    methylPREDNISolone acetate (DEPO-MEDROL) injection 80 mg      Plan: No additional findings.   Meds & Orders:  Meds ordered this encounter  Medications   methylPREDNISolone acetate (DEPO-MEDROL) injection 80 mg    Orders Placed This Encounter  Procedures   XR C-ARM NO REPORT   Epidural Steroid injection    Follow-up: Return for visit to requesting provider as needed.   Procedures: No procedures performed  Lumbar Epidural Steroid Injection - Interlaminar Approach with Fluoroscopic Guidance  Patient: Peter Best      Date of Birth: 23-Sep-1940 MRN: [id number] PCP: [name], MD      Visit Date: 11/15/2022   Universal Protocol:     Consent Given By: the patient  Position: PRONE  Additional Comments: Vital signs were monitored before and after the procedure. Patient was prepped and draped in the usual sterile fashion. The correct patient, procedure, and  site was verified.   Injection Procedure Details:   Procedure diagnoses: Lumbar radiculopathy [M54.16]   Meds Administered:  Meds ordered this encounter  Medications   methylPREDNISolone acetate (DEPO-MEDROL) injection 80 mg     Laterality: Left  Location/Site:  L3-4  Needle: 3.5 in., 20 ga. Tuohy  Needle Placement: Paramedian epidural  Findings:   -Comments: Excellent flow of contrast into the epidural space.  Procedure Details: Using a paramedian approach from the side mentioned above, the region overlying the inferior lamina was localized under fluoroscopic visualization and the soft tissues overlying this structure were infiltrated with 4 ml. of 1% Lidocaine without Epinephrine. The Tuohy needle was inserted into the epidural space using a paramedian approach.   The epidural space was localized using loss of resistance along with counter oblique bi-planar fluoroscopic views.  After negative aspirate for air, blood, and CSF, a 2 ml. volume of Isovue-250 was injected into the epidural space and the flow of contrast was observed. Radiographs were obtained for documentation purposes.    The injectate was administered into the level noted above.   Additional Comments:  No complications occurred Dressing: 2 x 2 sterile gauze and Band-Aid    Post-procedure details: Patient was observed during the procedure. Post-procedure instructions were reviewed.  Patient left the clinic in stable condition.   Clinical History: CLINICAL DATA:  82 year old male with low back pain radiating to the left lower extremity. Compression fracture.   EXAM: MRI LUMBAR SPINE WITHOUT CONTRAST   TECHNIQUE: Multiplanar, multisequence MR imaging of the lumbar spine  normal.     Left Ear: External ear normal.     Nose: No congestion.  Eyes:     Extraocular Movements: Extraocular movements intact.  Cardiovascular:     Rate and Rhythm: Normal rate.     Pulses: Normal pulses.  Pulmonary:     Effort: Pulmonary effort is normal. No respiratory distress.  Abdominal:     General: There is no  distension.     Palpations: Abdomen is soft.  Musculoskeletal:        General: No tenderness or signs of injury.     Cervical back: Neck supple.     Right lower leg: No edema.     Left lower leg: No edema.     Comments: Patient has good distal strength without clonus.  Skin:    Findings: No erythema or rash.  Neurological:     General: No focal deficit present.     Mental Status: He is alert and oriented to person, place, and time.     Sensory: No sensory deficit.     Motor: No weakness or abnormal muscle tone.     Coordination: Coordination normal.  Psychiatric:        Mood and Affect: Mood normal.        Behavior: Behavior normal.      Imaging: Epidural Steroid injection  Result Date: 11/15/2022 Tyrell Antonio, MD     11/15/2022  2:33 PM Lumbar Epidural Steroid Injection - Interlaminar Approach with Fluoroscopic Guidance Patient: Bosten Fagot     Date of Birth: March 27, 1940 MRN: [id number] PCP: [name], MD     Visit Date: 11/15/2022  Universal Protocol:   Consent Given By: the patient Position: PRONE Additional Comments: Vital signs were monitored before and after the procedure. Patient was prepped and draped in the usual sterile fashion. The correct patient, procedure, and site was verified. Injection Procedure Details: Procedure diagnoses: Lumbar radiculopathy [M54.16] Meds Administered: Meds ordered this encounter Medications  methylPREDNISolone acetate (DEPO-MEDROL) injection 80 mg  Laterality: Left Location/Site:  L3-4 Needle: 3.5 in., 20 ga. Tuohy Needle Placement: Paramedian epidural Findings:  -Comments: Excellent flow of contrast into the epidural space. Procedure Details: Using a paramedian approach from the side mentioned above, the region overlying the inferior lamina was localized under fluoroscopic visualization and the soft tissues overlying this structure were infiltrated with 4 ml. of 1% Lidocaine without Epinephrine. The Tuohy needle was inserted into the epidural  space using a paramedian approach. The epidural space was localized using loss of resistance along with counter oblique bi-planar fluoroscopic views.  After negative aspirate for air, blood, and CSF, a 2 ml. volume of Isovue-250 was injected into the epidural space and the flow of contrast was observed. Radiographs were obtained for documentation purposes.  The injectate was administered into the level noted above. Additional Comments: No complications occurred Dressing: 2 x 2 sterile gauze and Band-Aid  Post-procedure details: Patient was observed during the procedure. Post-procedure instructions were reviewed. Patient left the clinic in stable condition.

## 2022-11-15 NOTE — Telephone Encounter (Signed)
Pt called requesting a refill of tramadol. Please send to [facility]. Pt phone number is [phone].

## 2022-11-15 NOTE — Progress Notes (Signed)
Functional Pain Scale - descriptive words and definitions  Distressing (6)    Pain is present/unable to complete most ADLs limited by pain/sleep is difficult and active distraction is only marginal. Moderate range order  Average Pain  varies, comes and goes   +Driver, -BT, -Dye Allergies.  Lower back pain on left side that radiates to the calf in the left leg

## 2022-11-15 NOTE — Procedures (Signed)
Lumbar Epidural Steroid Injection - Interlaminar Approach with Fluoroscopic Guidance  Patient: Peter Best      Date of Birth: 1940-07-10 MRN: [id number] PCP: [name], MD      Visit Date: 11/15/2022   Universal Protocol:     Consent Given By: the patient  Position: PRONE  Additional Comments: Vital signs were monitored before and after the procedure. Patient was prepped and draped in the usual sterile fashion. The correct patient, procedure, and site was verified.   Injection Procedure Details:   Procedure diagnoses: Lumbar radiculopathy [M54.16]   Meds Administered:  Meds ordered this encounter  Medications   methylPREDNISolone acetate (DEPO-MEDROL) injection 80 mg     Laterality: Left  Location/Site:  L3-4  Needle: 3.5 in., 20 ga. Tuohy  Needle Placement: Paramedian epidural  Findings:   -Comments: Excellent flow of contrast into the epidural space.  Procedure Details: Using a paramedian approach from the side mentioned above, the region overlying the inferior lamina was localized under fluoroscopic visualization and the soft tissues overlying this structure were infiltrated with 4 ml. of 1% Lidocaine without Epinephrine. The Tuohy needle was inserted into the epidural space using a paramedian approach.   The epidural space was localized using loss of resistance along with counter oblique bi-planar fluoroscopic views.  After negative aspirate for air, blood, and CSF, a 2 ml. volume of Isovue-250 was injected into the epidural space and the flow of contrast was observed. Radiographs were obtained for documentation purposes.    The injectate was administered into the level noted above.   Additional Comments:  No complications occurred Dressing: 2 x 2 sterile gauze and Band-Aid    Post-procedure details: Patient was observed during the procedure. Post-procedure instructions were reviewed.  Patient left the clinic in stable condition.

## 2022-11-16 ENCOUNTER — Ambulatory Visit: Payer: Medicare HMO | Admitting: Orthopedic Surgery

## 2022-11-21 ENCOUNTER — Telehealth: Payer: Self-pay | Admitting: Orthopedic Surgery

## 2022-11-21 NOTE — Telephone Encounter (Signed)
Pt called requesting a call back. Pt states he had an cortisone injectio and asking if there is any restriction for physical therapy. Please call pt at [phone] or  [phone].

## 2022-11-22 NOTE — Telephone Encounter (Signed)
I called and advised patient of Dr. Kathi Der message and advised that PT had sent an order over to our office, and that I noted this on that order as well before I faxed it back to them

## 2022-11-28 ENCOUNTER — Other Ambulatory Visit (INDEPENDENT_AMBULATORY_CARE_PROVIDER_SITE_OTHER): Payer: Medicare HMO

## 2022-11-28 ENCOUNTER — Ambulatory Visit: Payer: Self-pay | Admitting: Orthopedic Surgery

## 2022-11-28 DIAGNOSIS — M5416 Radiculopathy, lumbar region: Secondary | ICD-10-CM

## 2022-11-28 MED ORDER — GABAPENTIN 100 MG PO CAPS: 100.0000 mg | ORAL_CAPSULE | Freq: Three times a day (TID) | ORAL | 1 refills | Status: DC

## 2022-11-28 NOTE — Progress Notes (Signed)
Orthopedic Spine Surgery Office Note   Assessment: Patient is a 82 y.o. male with L4 burst fracture with retropulsion. 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 Now ~3 months from injury   Plan: -No operative intervention planned at this time -Out of bed as tolerated, no brace -Prescribed more gabapentin for him.  Told him to keep using Tylenol as well -Patient has tried tylenol, ibuprofen, heating pads, gabapentin, tramadol, lumbar steroid injection -Patient should return to office in 6 weeks, x-rays at next visit: AP/lateral lumbar     Patient expressed understanding of the plan and all questions were answered to the patient's satisfaction.    ___________________________________________________________________________     History:   Patient is a 82 y.o. male who is returning for his lumbar spine.  Patient has noted some soreness in his back in the mornings but that generally resolves shortly after getting out of bed.  His leg pain has improved significantly with the injection.  He said he is about 80% better.  His leg pain is currently tolerable.  He feels the pain in the bilateral legs in the buttock and posterior thighs.  After the injection, he feels it more on the right leg than the left.  He has not developed any new symptoms since he was last seen.  No saddle anesthesia.  Denies bowel and bladder incontinence.   Treatments tried: Heating pad, ibuprofen, Tylenol, gabapentin, lumbar steroid injection     Physical Exam:   General: no acute distress, appears stated age, ambulating without assistive devices Neurologic: alert, answering questions appropriately, following commands Respiratory: unlabored breathing on room air, symmetric chest rise Psychiatric: appropriate affect, normal cadence to speech     MSK (spine):   -Strength exam                                                   Left                  Right EHL                              5/5                   5/5 TA                                 5/5                  5/5 GSC                             5/5                  5/5 Knee extension            5/5                  5/5 Hip flexion                    5/5                  5/5   -Sensory exam  Sensation intact to light touch in L3-S1 nerve distributions of bilateral lower extremities   -Straight leg raise: negative bilaterally -Femoral nerve stretch test: negative bilaterally -Clonus: no beats bilaterally   Imaging: XRs of the lumbar spine from 11/28/2022 were independently reviewed and interpreted, showing L4 burst fracture with significant anterior height loss.  There has been interval anterior height loss since films on 09/02/2022.  No new fracture seen.  Disc height loss at L5/S1.   MRI of the lumbar spine from 09/06/2022 was previously independently reviewed and interpreted, showing moderate central stenosis at L2/3. Burst fracture with retropulsion causing central stenosis posterior to the body of L3 and L4. Foraminal stenosis bilaterally at L5/S1.      Patient name: Peter Best Patient MRN: [id number] Date of visit: 11/28/22

## 2022-12-08 DIAGNOSIS — R197 Diarrhea, unspecified: Secondary | ICD-10-CM | POA: Diagnosis not present

## 2022-12-15 DIAGNOSIS — R296 Repeated falls: Secondary | ICD-10-CM | POA: Diagnosis not present

## 2022-12-15 DIAGNOSIS — R2681 Unsteadiness on feet: Secondary | ICD-10-CM | POA: Diagnosis not present

## 2022-12-15 DIAGNOSIS — R2689 Other abnormalities of gait and mobility: Secondary | ICD-10-CM | POA: Diagnosis not present

## 2022-12-21 ENCOUNTER — Ambulatory Visit: Payer: Medicare HMO | Admitting: Gastroenterology

## 2022-12-21 DIAGNOSIS — R2681 Unsteadiness on feet: Secondary | ICD-10-CM | POA: Diagnosis not present

## 2022-12-21 DIAGNOSIS — R296 Repeated falls: Secondary | ICD-10-CM | POA: Diagnosis not present

## 2022-12-21 DIAGNOSIS — R2689 Other abnormalities of gait and mobility: Secondary | ICD-10-CM | POA: Diagnosis not present

## 2022-12-22 ENCOUNTER — Telehealth: Payer: Self-pay | Admitting: Orthopedic Surgery

## 2022-12-22 MED ORDER — PREGABALIN 75 MG PO CAPS: 75.0000 mg | ORAL_CAPSULE | Freq: Two times a day (BID) | ORAL | 0 refills | Status: AC

## 2022-12-22 NOTE — Telephone Encounter (Signed)
I called and spoke with patient, Peter Best states that his leg pain is getting unbearable for him.  Per Dr. Christell Constant we can send in Lyrica for him to try and then Peter Best can follow up on 01/09/23 to discuss other options. Patient agreed to this.

## 2022-12-22 NOTE — Telephone Encounter (Signed)
I tried calling again - no answer.

## 2022-12-22 NOTE — Telephone Encounter (Signed)
Orthopedic Telephone Call  Patient called. He is still having significant leg pain. We have tried multiple conservative treatments. We can try lyrica and stop the gabapentin which he was interested. This was sent in to his pharmacy. He has an appt with me on 01/09/2023. We will see if the lyrica is providing him with adequate relief. If it is, we will continue that. If it is not, then his options would be surgery or pain management.   London Sheer, MD Orthopedic Surgeon

## 2022-12-22 NOTE — Telephone Encounter (Signed)
Lmom for pt to call me back. 

## 2022-12-22 NOTE — Telephone Encounter (Signed)
Patient would like for you to call him back. He did not specify in what he was needing, just that he wanted to speak with you. Thanks

## 2022-12-23 DIAGNOSIS — F3341 Major depressive disorder, recurrent, in partial remission: Secondary | ICD-10-CM | POA: Diagnosis not present

## 2022-12-23 DIAGNOSIS — G894 Chronic pain syndrome: Secondary | ICD-10-CM | POA: Diagnosis not present

## 2022-12-23 DIAGNOSIS — I5189 Other ill-defined heart diseases: Secondary | ICD-10-CM | POA: Diagnosis not present

## 2022-12-23 DIAGNOSIS — I10 Essential (primary) hypertension: Secondary | ICD-10-CM | POA: Diagnosis not present

## 2022-12-27 ENCOUNTER — Telehealth: Payer: Self-pay | Admitting: Orthopedic Surgery

## 2022-12-27 DIAGNOSIS — R2689 Other abnormalities of gait and mobility: Secondary | ICD-10-CM | POA: Diagnosis not present

## 2022-12-27 DIAGNOSIS — R296 Repeated falls: Secondary | ICD-10-CM | POA: Diagnosis not present

## 2022-12-27 DIAGNOSIS — R2681 Unsteadiness on feet: Secondary | ICD-10-CM | POA: Diagnosis not present

## 2022-12-27 MED ORDER — TRAMADOL HCL 50 MG PO TABS
50.0000 mg | ORAL_TABLET | Freq: Four times a day (QID) | ORAL | 0 refills | Status: AC | PRN
Start: 2022-12-27 — End: 2023-01-01

## 2022-12-27 NOTE — Telephone Encounter (Signed)
Patient called. Says he needs a pain medication called in for him and nothing else. His call back number is [phone]

## 2022-12-28 ENCOUNTER — Telehealth: Payer: Self-pay | Admitting: Orthopedic Surgery

## 2022-12-28 DIAGNOSIS — R2689 Other abnormalities of gait and mobility: Secondary | ICD-10-CM | POA: Diagnosis not present

## 2022-12-28 DIAGNOSIS — R296 Repeated falls: Secondary | ICD-10-CM | POA: Diagnosis not present

## 2022-12-28 DIAGNOSIS — R2681 Unsteadiness on feet: Secondary | ICD-10-CM | POA: Diagnosis not present

## 2022-12-28 NOTE — Telephone Encounter (Signed)
Pt called in regards to back fractures. Please call [phone]

## 2022-12-28 NOTE — Telephone Encounter (Signed)
I called and he states that the Lyrica was useless, he states that he has taken it for days. I advised him that it does tae 2-3 weeks to get in his system good and start noticing a difference with it. Patient is scheduled for follow up on 01/09/23, I advised him to take it till then and then we will evaluate him for his appt.

## 2022-12-30 ENCOUNTER — Telehealth: Payer: Self-pay

## 2022-12-30 ENCOUNTER — Telehealth: Payer: Self-pay | Admitting: Orthopedic Surgery

## 2022-12-30 NOTE — Telephone Encounter (Signed)
I called and lmom for pt to call me back 

## 2022-12-30 NOTE — Telephone Encounter (Signed)
Patient called and said he is having issues with his back. [phone]

## 2022-12-30 NOTE — Telephone Encounter (Signed)
Duplicate message. 

## 2022-12-30 NOTE — Telephone Encounter (Signed)
Patient called and left a VM stating that he needed to speak with you.  Stated that he may have reinjured his back.  CB# [phone].  Please advise.  Thank you.

## 2022-12-31 ENCOUNTER — Encounter: Payer: Self-pay | Admitting: Orthopedic Surgery

## 2023-01-02 ENCOUNTER — Ambulatory Visit: Payer: Medicare HMO | Admitting: Orthopedic Surgery

## 2023-01-02 DIAGNOSIS — M5416 Radiculopathy, lumbar region: Secondary | ICD-10-CM | POA: Diagnosis not present

## 2023-01-02 NOTE — Telephone Encounter (Signed)
I call and lmom @ both numbers in chart that we can see him today at 4pm.

## 2023-01-02 NOTE — Telephone Encounter (Signed)
Patient did call back and confirm that he will be here this afternoon

## 2023-01-02 NOTE — Telephone Encounter (Signed)
See my chart message

## 2023-01-02 NOTE — Progress Notes (Signed)
Orthopedic Spine Surgery Office Note   Assessment: Patient is a 82 y.o. male with L4 burst fracture with retropulsion. 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 I told him the fact that his back pain has gotten better points towards the fact that his fracture is healing. I also told him that in many people the retropulsed bone from the fracture can resorb over time and help with the leg pain. Usually I would expect to see some improvement in his leg pain by this time which has not happened. Accordingly, I told him that his leg pain may persist and he may elect to undergo surgery for that problem in the future -At this time, he is interested in avoiding surgery. He did get significant relief (80%) with an injection in the past, so he was interested in trying another injection. A referral was provided to Dr. [place] [facility] office -To give him some pain relief until that injection can be done, told him to continue to take the tramadol and provide him with a intramuscular steroid injection today in the office -Patient has tried tylenol, ibuprofen, heating pads, gabapentin, Lyrica, tramadol, lumbar steroid injection -I briefly talked about surgical management in the form of decompression to help with his leg pain.  Given his significant fracture, would recommend for posterior instrumented spinal fusion in that scenario.  He is moving to [place] soon.  If his symptoms remain refractory to conservative treatments and he does elect to undergo surgery, and may be logistically easier to recover in [place] near family which I went over with him today     Patient expressed understanding of the plan and all questions were answered to the patient's satisfaction.     Intramuscular steroid injection: After discussing the risks, benefits, alternatives of intramuscular steroid  injection, patient elected to proceed.  An area over the patient's left gluteus muscle was prepped with an alcohol based prep.  The skin was anesthetized with ethyl chloride.  A 20-gauge needle was used to inject 80mg  of Depo-Medrol under standard sterile technique.  Needle was withdrawn and a Band-Aid was applied.  Patient tolerated the procedure well. ___________________________________________________________________________     History:   Patient is a 82 y.o. male who is returning for his lumbar spine.  Patient is still not having much back pain.  However, he has had significant leg pain.  He states that he has had pain radiating into his left leg along the posterior aspect of the thigh and calf.  He has some pain into the right leg as well but is not as significant as the left leg.  He has been using a walker because the leg has given out on him twice now.  He feels that the pain is worse in the morning and at night.  No saddle anesthesia.  Denies bowel or bladder incontinence.  He rates the pain as a 8 out of 10 in his left leg.  He has gotten an injection in the past which provided him with 80% relief.  He is interested in trying another injection to avoid surgical management.    Treatments tried: Heating pad, ibuprofen, Tylenol, gabapentin, lumbar steroid injection     Physical Exam:   General: no acute distress, appears stated age, ambulating without assistive devices Neurologic: alert, answering questions appropriately, following commands Respiratory: unlabored breathing on room air, symmetric chest rise Psychiatric: appropriate affect,  normal cadence to speech     MSK (spine):   -Strength exam                                                   Left                  Right EHL                              5/5                  5/5 TA                                 5/5                  5/5 GSC                             5/5                  5/5 Knee extension            5/5                   5/5 Hip flexion                    5/5                  5/5   -Sensory exam                           Sensation intact to light touch in L3-S1 nerve distributions of bilateral lower extremities   -Straight leg raise: negative bilaterally -Femoral nerve stretch test: negative bilaterally -Clonus: no beats bilaterally   Imaging: XRs of the lumbar spine from 11/28/2022 were previously independently reviewed and interpreted, showing L4 burst fracture with significant anterior height loss.  There has been interval anterior height loss since films on 09/02/2022.  No new fracture seen.  Disc height loss at L5/S1.   MRI of the lumbar spine from 09/06/2022 was previously independently reviewed and interpreted, showing moderate central stenosis at L2/3. Burst fracture with retropulsion causing central stenosis posterior to the body of L3 and L4. Foraminal stenosis bilaterally at L5/S1.      Patient name: Peter Best Patient MRN: [id number] Date of visit: 01/02/23

## 2023-01-03 ENCOUNTER — Telehealth: Payer: Self-pay | Admitting: Orthopedic Surgery

## 2023-01-03 NOTE — Telephone Encounter (Signed)
I called and spoke with patient he is asking: 1) Should he continue with the Lyrica? 2) Should he continue with PT? They are saying that they should not 3) Does he need to keep his appointment on 01/09/23? 4) he is using the Tramadol for pain, is there a length of time that he should space the Tramadol and Lyrica out?

## 2023-01-03 NOTE — Telephone Encounter (Signed)
Patient called and said he has several question related to the visit he had yesterday with Dr. Christell Constant. CB#[phone]

## 2023-01-04 ENCOUNTER — Telehealth: Payer: Self-pay | Admitting: Orthopedic Surgery

## 2023-01-04 NOTE — Telephone Encounter (Signed)
Patient called and said he a few questions and if you call him. CB#[phone]

## 2023-01-05 NOTE — Telephone Encounter (Signed)
See other message

## 2023-01-05 NOTE — Telephone Encounter (Signed)
I called and advised patient of Dr. Buel Ream response and he is fine this.

## 2023-01-09 ENCOUNTER — Ambulatory Visit: Payer: Medicare HMO | Admitting: Orthopedic Surgery

## 2023-01-09 ENCOUNTER — Telehealth: Payer: Self-pay | Admitting: Orthopedic Surgery

## 2023-01-09 MED ORDER — TRAMADOL HCL 50 MG PO TABS: 50.0000 mg | ORAL_TABLET | Freq: Four times a day (QID) | ORAL | 0 refills | Status: AC | PRN

## 2023-01-09 NOTE — Telephone Encounter (Signed)
Pt requesting Tramadol refill please advise

## 2023-01-10 ENCOUNTER — Ambulatory Visit: Payer: Medicare HMO | Admitting: Physical Medicine and Rehabilitation

## 2023-01-10 ENCOUNTER — Other Ambulatory Visit: Payer: Self-pay

## 2023-01-10 VITALS — BP 120/69 | HR 101

## 2023-01-10 DIAGNOSIS — M5416 Radiculopathy, lumbar region: Secondary | ICD-10-CM

## 2023-01-10 MED ORDER — METHYLPREDNISOLONE ACETATE 40 MG/ML IJ SUSP
40.0000 mg | Freq: Once | INTRAMUSCULAR | Status: AC
Start: 2023-01-10 — End: 2023-01-10
  Administered 2023-01-10: 40 mg

## 2023-01-10 NOTE — Patient Instructions (Signed)

## 2023-01-10 NOTE — Progress Notes (Signed)
Peter Best - 82 y.o. male MRN [id number]  Date of birth: 11/05/1940  Office Visit Note: Visit Date: 01/10/2023 PCP: [name], MD (Inactive) Referred by: London Sheer, MD  Subjective: Chief Complaint  Patient presents with   Lower Back - Pain   HPI:  Peter Best is a 82 y.o. male who comes in today at the request of Dr. Willia Craze for planned Left L3-4 Lumbar Transforaminal epidural steroid injection with fluoroscopic guidance.  The patient has failed conservative care including home exercise, medications, time and activity modification.  This injection will be diagnostic and hopefully therapeutic.  Please see requesting physician notes for further details and justification. Prior interlaminar injection did help.   ROS Otherwise per HPI.  Assessment & Plan: Visit Diagnoses:    ICD-10-CM   1. Lumbar radiculopathy  M54.16 XR C-ARM NO REPORT    Epidural Steroid injection    methylPREDNISolone acetate (DEPO-MEDROL) injection 40 mg      Plan: No additional findings.   Meds & Orders:  Meds ordered this encounter  Medications   methylPREDNISolone acetate (DEPO-MEDROL) injection 40 mg    Orders Placed This Encounter  Procedures   XR C-ARM NO REPORT   Epidural Steroid injection    Follow-up: Return for visit to requesting provider as needed.   Procedures: No procedures performed  Lumbosacral Transforaminal Epidural Steroid Injection - Sub-Pedicular Approach with Fluoroscopic Guidance  Patient: Peter Best      Date of Birth: 20-Feb-1941 MRN: [id number] PCP: [name], MD (Inactive)      Visit Date: 01/10/2023   Universal Protocol:    Date/Time: 01/10/2023  Consent Given By: the patient  Position: PRONE  Additional Comments: Vital signs were monitored before and after the procedure. Patient was prepped and draped in the usual sterile fashion. The correct patient, procedure, and site was verified.   Injection Procedure Details:    Procedure diagnoses: Lumbar radiculopathy [M54.16]    Meds Administered:  Meds ordered this encounter  Medications   methylPREDNISolone acetate (DEPO-MEDROL) injection 40 mg    Laterality: Left  Location/Site: L3  Needle:5.0 in., 22 ga.  Short bevel or Quincke spinal needle  Needle Placement: Transforaminal  Findings:    -Comments: Excellent flow of contrast along the nerve, nerve root and into the epidural space.  Procedure Details: After squaring off the end-plates to get a true AP view, the C-arm was positioned so that an oblique view of the foramen as noted above was visualized. The target area is just inferior to the "nose of the scotty dog" or sub pedicular. The soft tissues overlying this structure were infiltrated with 2-3 ml. of 1% Lidocaine without Epinephrine.  The spinal needle was inserted toward the target using a "trajectory" view along the fluoroscope beam.  Under AP and lateral visualization, the needle was advanced so it did not puncture dura and was located close the 6 O'Clock position of the pedical in AP tracterory. Biplanar projections were used to confirm position. Aspiration was confirmed to be negative for CSF and/or blood. A 1-2 ml. volume of Isovue-250 was injected and flow of contrast was noted at each level. Radiographs were obtained for documentation purposes.   After attaining the desired flow of contrast documented above, a 0.5 to 1.0 ml test dose of 0.25% Marcaine was injected into each respective transforaminal space.  The patient was observed for 90 seconds post injection.  After no sensory deficits were reported, and normal lower extremity motor function was noted,   the  above injectate was administered so that equal amounts of the injectate were placed at each foramen (level) into the transforaminal epidural space.   Additional Comments:  No complications occurred Dressing: 2 x 2 sterile gauze and Band-Aid    Post-procedure details: Patient  was observed during the procedure. Post-procedure instructions were reviewed.  Patient left the clinic in stable condition.    Clinical History: CLINICAL DATA:  82 year old male with low back pain radiating to the left lower extremity. Compression fracture.   EXAM: MRI LUMBAR SPINE WITHOUT CONTRAST   TECHNIQUE: Multiplanar, multisequence MR imaging of the lumbar spine was performed. No intravenous contrast was administered.   COMPARISON:  Lumbar radiographs 09/02/2022.   FINDINGS: Segmentation:  Normal on the comparison.   Alignment: Stable straightening of lumbar lordosis compared to the recent radiographs.   Vertebrae: L4 vertebral plana, with bulky retropulsion of bone (series 3, image 10). Up to moderate patchy edema within the compressed L4 vertebral body. Associated fracture through the left anterior pedicle (series 4, image 14). But the L4 posterior elements remain aligned.   Background bone marrow signal is normal. No other significant marrow heterogeneity or edema (mild degenerative far lateral L5-S1 endplate changes on the right. And maintained other lumbar and visible lower thoracic vertebral height.   Conus medullaris and cauda equina: Conus extends to the L1 level. No lower spinal cord or conus signal abnormality. Cauda equina nerve roots above L3-L4 appear unremarkable.   Paraspinal and other soft tissues: Negative visible abdominal viscera. Lumbar paraspinal soft tissues remain within normal limits.   Disc levels:   T11-T12 negative.   T12-L1:  Negative.   L1-L2: Disc desiccation and disc space loss. Subtle left paracentral and cephalad disc extrusion on series 3, image 10 and series 6, image 7. But no significant stenosis.   L2-L3: Disc desiccation and disc space loss. Circumferential disc bulge eccentric to the left with broad-based subarticular involvement bilaterally. Mild to moderate facet and ligament flavum hypertrophy with degenerative  facet joint fluid. Mild spinal stenosis. Mild to moderate lateral recess stenosis (L3 nerve levels). No convincing L2 foraminal stenosis.   L3-L4: Bulky disc osteophyte complex in large part due to retropulsion of L4. Superimposed up to moderate facet and ligament flavum hypertrophy. Trace degenerative facet joint fluid. Severe spinal stenosis just below the disc level. Symmetric lateral recess involvement. Mild to moderate left, mild right L3 foraminal stenosis.   L4-L5: Retropulsion just above this disc level with severe spinal stenosis as seen on series 3, image 10. Mild circumferential disc bulging here. Mild to moderate facet and ligament flavum hypertrophy with trace degenerative facet joint fluid. Small posteriorly situated synovial cyst on the left which should not cause neural compromise. Moderate bilateral L4 foraminal stenosis, in part related to the retropulsion. But no significant spinal or lateral recess stenosis.   L5-S1: Disc desiccation and disc space loss. Subtle retrolisthesis. Circumferential disc osteophyte complex. Mild to moderate facet hypertrophy. Posteriorly situated synovial cyst on the left. No spinal or convincing lateral recess stenosis. Moderate to severe bilateral L5 foraminal stenosis.   IMPRESSION: 1. Severe subacute appearing L4 compression fracture with Vertebral Plana. 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 Legrand Rams this is a benign compression fracture, with other visible marrow signal normal for age.   2. Lumbar spine degeneration elsewhere with mild multifactorial spinal stenosis at L2-L3, up to moderate L2 and L3 nerve level stenosis, and moderate to severe bilateral L5  neural foraminal stenosis.     Electronically Signed   By: Odessa Fleming M.D.   On: 09/06/2022 13:02     Objective:  VS:  HT:    WT:   BMI:     BP:120/69  HR:(!) 101bpm  TEMP: ( )   RESP:  Physical Exam Vitals and nursing note reviewed.  Constitutional:      General: He is not in acute distress.    Appearance: Normal appearance. He is not ill-appearing.  HENT:     Head: Normocephalic and atraumatic.     Right Ear: External ear normal.     Left Ear: External ear normal.     Nose: No congestion.  Eyes:     Extraocular Movements: Extraocular movements intact.  Cardiovascular:     Rate and Rhythm: Normal rate.     Pulses: Normal pulses.  Pulmonary:     Effort: Pulmonary effort is normal. No respiratory distress.  Abdominal:     General: There is no distension.     Palpations: Abdomen is soft.  Musculoskeletal:        General: No tenderness or signs of injury.     Cervical back: Neck supple.     Right lower leg: No edema.     Left lower leg: No edema.     Comments: Patient has good distal strength without clonus. Ambulated with walker.   Skin:    Findings: No erythema or rash.  Neurological:     General: No focal deficit present.     Mental Status: He is alert and oriented to person, place, and time.     Sensory: No sensory deficit.     Motor: No weakness or abnormal muscle tone.     Coordination: Coordination normal.  Psychiatric:        Mood and Affect: Mood normal.        Behavior: Behavior normal.      Imaging: No results found.

## 2023-01-10 NOTE — Procedures (Signed)
Lumbosacral Transforaminal Epidural Steroid Injection - Sub-Pedicular Approach with Fluoroscopic Guidance  Patient: Peter Best      Date of Birth: 1940-09-21 MRN: [id number] PCP: [name], MD (Inactive)      Visit Date: 01/10/2023   Universal Protocol:    Date/Time: 01/10/2023  Consent Given By: the patient  Position: PRONE  Additional Comments: Vital signs were monitored before and after the procedure. Patient was prepped and draped in the usual sterile fashion. The correct patient, procedure, and site was verified.   Injection Procedure Details:   Procedure diagnoses: Lumbar radiculopathy [M54.16]    Meds Administered:  Meds ordered this encounter  Medications   methylPREDNISolone acetate (DEPO-MEDROL) injection 40 mg    Laterality: Left  Location/Site: L3  Needle:5.0 in., 22 ga.  Short bevel or Quincke spinal needle  Needle Placement: Transforaminal  Findings:    -Comments: Excellent flow of contrast along the nerve, nerve root and into the epidural space.  Procedure Details: After squaring off the end-plates to get a true AP view, the C-arm was positioned so that an oblique view of the foramen as noted above was visualized. The target area is just inferior to the "nose of the scotty dog" or sub pedicular. The soft tissues overlying this structure were infiltrated with 2-3 ml. of 1% Lidocaine without Epinephrine.  The spinal needle was inserted toward the target using a "trajectory" view along the fluoroscope beam.  Under AP and lateral visualization, the needle was advanced so it did not puncture dura and was located close the 6 O'Clock position of the pedical in AP tracterory. Biplanar projections were used to confirm position. Aspiration was confirmed to be negative for CSF and/or blood. A 1-2 ml. volume of Isovue-250 was injected and flow of contrast was noted at each level. Radiographs were obtained for documentation purposes.   After attaining the  desired flow of contrast documented above, a 0.5 to 1.0 ml test dose of 0.25% Marcaine was injected into each respective transforaminal space.  The patient was observed for 90 seconds post injection.  After no sensory deficits were reported, and normal lower extremity motor function was noted,   the above injectate was administered so that equal amounts of the injectate were placed at each foramen (level) into the transforaminal epidural space.   Additional Comments:  No complications occurred Dressing: 2 x 2 sterile gauze and Band-Aid    Post-procedure details: Patient was observed during the procedure. Post-procedure instructions were reviewed.  Patient left the clinic in stable condition.

## 2023-01-10 NOTE — Progress Notes (Signed)
Functional Pain Scale - descriptive words and definitions  Uncomfortable (3)  Pain is present but can complete all ADL's/sleep is slightly affected and passive distraction only gives marginal relief. Mild range order  Average Pain 5   +Driver, -BT, -Dye Allergies.  Lower back pain on left side

## 2023-01-12 ENCOUNTER — Telehealth: Payer: Self-pay | Admitting: Physical Medicine and Rehabilitation

## 2023-01-12 ENCOUNTER — Ambulatory Visit: Payer: Self-pay | Admitting: Physician Assistant

## 2023-01-12 NOTE — Telephone Encounter (Signed)
Pt called in stating after his injection between his hip and knee on the left is sore would like to know if this is normal please advise

## 2023-01-20 ENCOUNTER — Telehealth: Payer: Self-pay | Admitting: Physician Assistant

## 2023-01-20 NOTE — Telephone Encounter (Signed)
Pt called in stating he has seen Prevogen on TV and is wondering if that is something he might should take or stay away from?

## 2023-01-23 NOTE — Telephone Encounter (Signed)
I left detailed message on voicemail, to call if any questions regading otc meds

## 2023-02-01 DIAGNOSIS — M5136 Other intervertebral disc degeneration, lumbar region with discogenic back pain only: Secondary | ICD-10-CM | POA: Diagnosis not present

## 2023-02-01 DIAGNOSIS — R1013 Epigastric pain: Secondary | ICD-10-CM | POA: Diagnosis not present

## 2023-02-01 DIAGNOSIS — M4846XG Fatigue fracture of vertebra, lumbar region, subsequent encounter for fracture with delayed healing: Secondary | ICD-10-CM | POA: Diagnosis not present

## 2023-02-01 DIAGNOSIS — I10 Essential (primary) hypertension: Secondary | ICD-10-CM | POA: Diagnosis not present

## 2023-02-01 DIAGNOSIS — M2578 Osteophyte, vertebrae: Secondary | ICD-10-CM | POA: Diagnosis not present

## 2023-02-01 DIAGNOSIS — Z1329 Encounter for screening for other suspected endocrine disorder: Secondary | ICD-10-CM | POA: Diagnosis not present

## 2023-02-01 DIAGNOSIS — S32040A Wedge compression fracture of fourth lumbar vertebra, initial encounter for closed fracture: Secondary | ICD-10-CM | POA: Diagnosis not present

## 2023-02-01 DIAGNOSIS — G3184 Mild cognitive impairment, so stated: Secondary | ICD-10-CM | POA: Diagnosis not present

## 2023-02-01 DIAGNOSIS — R5383 Other fatigue: Secondary | ICD-10-CM | POA: Diagnosis not present

## 2023-02-01 DIAGNOSIS — F0390 Unspecified dementia without behavioral disturbance: Secondary | ICD-10-CM | POA: Diagnosis not present

## 2023-02-17 DIAGNOSIS — M48061 Spinal stenosis, lumbar region without neurogenic claudication: Secondary | ICD-10-CM | POA: Diagnosis not present

## 2023-02-17 DIAGNOSIS — M48062 Spinal stenosis, lumbar region with neurogenic claudication: Secondary | ICD-10-CM | POA: Diagnosis not present

## 2023-02-17 DIAGNOSIS — S32040A Wedge compression fracture of fourth lumbar vertebra, initial encounter for closed fracture: Secondary | ICD-10-CM | POA: Diagnosis not present

## 2023-02-17 DIAGNOSIS — I700 Atherosclerosis of aorta: Secondary | ICD-10-CM | POA: Diagnosis not present

## 2023-02-17 DIAGNOSIS — M545 Low back pain, unspecified: Secondary | ICD-10-CM | POA: Diagnosis not present

## 2023-02-17 DIAGNOSIS — M5410 Radiculopathy, site unspecified: Secondary | ICD-10-CM | POA: Diagnosis not present

## 2023-03-01 ENCOUNTER — Ambulatory Visit: Payer: Medicare HMO | Admitting: Physician Assistant

## 2023-03-05 ENCOUNTER — Other Ambulatory Visit: Payer: Self-pay | Admitting: Orthopedic Surgery

## 2023-03-05 ENCOUNTER — Other Ambulatory Visit: Payer: Self-pay | Admitting: Nurse Practitioner

## 2023-03-14 DIAGNOSIS — Z741 Need for assistance with personal care: Secondary | ICD-10-CM | POA: Diagnosis not present

## 2023-03-14 DIAGNOSIS — L02416 Cutaneous abscess of left lower limb: Secondary | ICD-10-CM | POA: Diagnosis not present

## 2023-03-21 DIAGNOSIS — M47896 Other spondylosis, lumbar region: Secondary | ICD-10-CM | POA: Diagnosis not present

## 2023-03-21 DIAGNOSIS — M48061 Spinal stenosis, lumbar region without neurogenic claudication: Secondary | ICD-10-CM | POA: Diagnosis not present

## 2023-03-21 DIAGNOSIS — G9529 Other cord compression: Secondary | ICD-10-CM | POA: Diagnosis not present

## 2023-03-21 DIAGNOSIS — M4856XS Collapsed vertebra, not elsewhere classified, lumbar region, sequela of fracture: Secondary | ICD-10-CM | POA: Diagnosis not present

## 2023-03-21 DIAGNOSIS — M9973 Connective tissue and disc stenosis of intervertebral foramina of lumbar region: Secondary | ICD-10-CM | POA: Diagnosis not present

## 2023-03-24 ENCOUNTER — Ambulatory Visit: Payer: Medicare HMO | Admitting: Gastroenterology

## 2023-03-30 DIAGNOSIS — G3184 Mild cognitive impairment, so stated: Secondary | ICD-10-CM | POA: Diagnosis not present

## 2023-03-30 DIAGNOSIS — F809 Developmental disorder of speech and language, unspecified: Secondary | ICD-10-CM | POA: Diagnosis not present

## 2023-03-30 DIAGNOSIS — F0390 Unspecified dementia without behavioral disturbance: Secondary | ICD-10-CM | POA: Diagnosis not present

## 2023-04-04 DIAGNOSIS — G4700 Insomnia, unspecified: Secondary | ICD-10-CM | POA: Diagnosis not present

## 2023-04-04 DIAGNOSIS — E782 Mixed hyperlipidemia: Secondary | ICD-10-CM | POA: Diagnosis not present

## 2023-04-04 DIAGNOSIS — M4846XS Fatigue fracture of vertebra, lumbar region, sequela of fracture: Secondary | ICD-10-CM | POA: Diagnosis not present

## 2023-04-04 DIAGNOSIS — G3184 Mild cognitive impairment, so stated: Secondary | ICD-10-CM | POA: Diagnosis not present

## 2023-04-06 DIAGNOSIS — E782 Mixed hyperlipidemia: Secondary | ICD-10-CM | POA: Diagnosis not present

## 2023-04-10 DIAGNOSIS — M48061 Spinal stenosis, lumbar region without neurogenic claudication: Secondary | ICD-10-CM | POA: Diagnosis not present

## 2023-04-10 DIAGNOSIS — S32040A Wedge compression fracture of fourth lumbar vertebra, initial encounter for closed fracture: Secondary | ICD-10-CM | POA: Diagnosis not present

## 2023-04-10 DIAGNOSIS — M48062 Spinal stenosis, lumbar region with neurogenic claudication: Secondary | ICD-10-CM | POA: Diagnosis not present

## 2023-04-10 DIAGNOSIS — M5410 Radiculopathy, site unspecified: Secondary | ICD-10-CM | POA: Diagnosis not present

## 2023-04-10 DIAGNOSIS — M545 Low back pain, unspecified: Secondary | ICD-10-CM | POA: Diagnosis not present

## 2023-04-11 DIAGNOSIS — E782 Mixed hyperlipidemia: Secondary | ICD-10-CM | POA: Diagnosis not present

## 2023-04-11 DIAGNOSIS — M4846XS Fatigue fracture of vertebra, lumbar region, sequela of fracture: Secondary | ICD-10-CM | POA: Diagnosis not present

## 2023-04-11 DIAGNOSIS — G3184 Mild cognitive impairment, so stated: Secondary | ICD-10-CM | POA: Diagnosis not present

## 2023-04-11 DIAGNOSIS — R1013 Epigastric pain: Secondary | ICD-10-CM | POA: Diagnosis not present

## 2023-04-11 DIAGNOSIS — G4700 Insomnia, unspecified: Secondary | ICD-10-CM | POA: Diagnosis not present

## 2023-04-17 DIAGNOSIS — M48061 Spinal stenosis, lumbar region without neurogenic claudication: Secondary | ICD-10-CM | POA: Diagnosis not present

## 2023-04-17 DIAGNOSIS — Z79891 Long term (current) use of opiate analgesic: Secondary | ICD-10-CM | POA: Diagnosis not present

## 2023-04-17 DIAGNOSIS — F0390 Unspecified dementia without behavioral disturbance: Secondary | ICD-10-CM | POA: Diagnosis not present

## 2023-04-17 DIAGNOSIS — E538 Deficiency of other specified B group vitamins: Secondary | ICD-10-CM | POA: Diagnosis not present

## 2023-04-17 DIAGNOSIS — G8929 Other chronic pain: Secondary | ICD-10-CM | POA: Diagnosis not present

## 2023-04-17 DIAGNOSIS — M5416 Radiculopathy, lumbar region: Secondary | ICD-10-CM | POA: Diagnosis not present

## 2023-04-17 DIAGNOSIS — R5383 Other fatigue: Secondary | ICD-10-CM | POA: Diagnosis not present

## 2023-04-17 DIAGNOSIS — I10 Essential (primary) hypertension: Secondary | ICD-10-CM | POA: Diagnosis not present

## 2023-04-17 DIAGNOSIS — M4846XG Fatigue fracture of vertebra, lumbar region, subsequent encounter for fracture with delayed healing: Secondary | ICD-10-CM | POA: Diagnosis not present

## 2023-04-17 DIAGNOSIS — H9192 Unspecified hearing loss, left ear: Secondary | ICD-10-CM | POA: Diagnosis not present

## 2023-04-20 DIAGNOSIS — M48061 Spinal stenosis, lumbar region without neurogenic claudication: Secondary | ICD-10-CM | POA: Diagnosis not present

## 2023-04-20 DIAGNOSIS — F0390 Unspecified dementia without behavioral disturbance: Secondary | ICD-10-CM | POA: Diagnosis not present

## 2023-04-20 DIAGNOSIS — G8929 Other chronic pain: Secondary | ICD-10-CM | POA: Diagnosis not present

## 2023-04-20 DIAGNOSIS — Z79891 Long term (current) use of opiate analgesic: Secondary | ICD-10-CM | POA: Diagnosis not present

## 2023-04-20 DIAGNOSIS — M5416 Radiculopathy, lumbar region: Secondary | ICD-10-CM | POA: Diagnosis not present

## 2023-04-20 DIAGNOSIS — H9192 Unspecified hearing loss, left ear: Secondary | ICD-10-CM | POA: Diagnosis not present

## 2023-04-20 DIAGNOSIS — M4846XG Fatigue fracture of vertebra, lumbar region, subsequent encounter for fracture with delayed healing: Secondary | ICD-10-CM | POA: Diagnosis not present

## 2023-04-20 DIAGNOSIS — I10 Essential (primary) hypertension: Secondary | ICD-10-CM | POA: Diagnosis not present

## 2023-04-20 DIAGNOSIS — E538 Deficiency of other specified B group vitamins: Secondary | ICD-10-CM | POA: Diagnosis not present

## 2023-04-20 DIAGNOSIS — R5383 Other fatigue: Secondary | ICD-10-CM | POA: Diagnosis not present

## 2023-04-21 DIAGNOSIS — F0390 Unspecified dementia without behavioral disturbance: Secondary | ICD-10-CM | POA: Diagnosis not present

## 2023-04-21 DIAGNOSIS — Z79891 Long term (current) use of opiate analgesic: Secondary | ICD-10-CM | POA: Diagnosis not present

## 2023-04-21 DIAGNOSIS — E538 Deficiency of other specified B group vitamins: Secondary | ICD-10-CM | POA: Diagnosis not present

## 2023-04-21 DIAGNOSIS — I10 Essential (primary) hypertension: Secondary | ICD-10-CM | POA: Diagnosis not present

## 2023-04-21 DIAGNOSIS — H9192 Unspecified hearing loss, left ear: Secondary | ICD-10-CM | POA: Diagnosis not present

## 2023-04-21 DIAGNOSIS — M4846XG Fatigue fracture of vertebra, lumbar region, subsequent encounter for fracture with delayed healing: Secondary | ICD-10-CM | POA: Diagnosis not present

## 2023-04-21 DIAGNOSIS — R5383 Other fatigue: Secondary | ICD-10-CM | POA: Diagnosis not present

## 2023-04-21 DIAGNOSIS — G8929 Other chronic pain: Secondary | ICD-10-CM | POA: Diagnosis not present

## 2023-04-21 DIAGNOSIS — M5416 Radiculopathy, lumbar region: Secondary | ICD-10-CM | POA: Diagnosis not present

## 2023-04-21 DIAGNOSIS — M48061 Spinal stenosis, lumbar region without neurogenic claudication: Secondary | ICD-10-CM | POA: Diagnosis not present

## 2023-04-24 DIAGNOSIS — F0390 Unspecified dementia without behavioral disturbance: Secondary | ICD-10-CM | POA: Diagnosis not present

## 2023-04-24 DIAGNOSIS — E538 Deficiency of other specified B group vitamins: Secondary | ICD-10-CM | POA: Diagnosis not present

## 2023-04-24 DIAGNOSIS — M48061 Spinal stenosis, lumbar region without neurogenic claudication: Secondary | ICD-10-CM | POA: Diagnosis not present

## 2023-04-24 DIAGNOSIS — M5416 Radiculopathy, lumbar region: Secondary | ICD-10-CM | POA: Diagnosis not present

## 2023-04-24 DIAGNOSIS — Z79891 Long term (current) use of opiate analgesic: Secondary | ICD-10-CM | POA: Diagnosis not present

## 2023-04-24 DIAGNOSIS — M4846XG Fatigue fracture of vertebra, lumbar region, subsequent encounter for fracture with delayed healing: Secondary | ICD-10-CM | POA: Diagnosis not present

## 2023-04-24 DIAGNOSIS — R5383 Other fatigue: Secondary | ICD-10-CM | POA: Diagnosis not present

## 2023-04-24 DIAGNOSIS — I10 Essential (primary) hypertension: Secondary | ICD-10-CM | POA: Diagnosis not present

## 2023-04-24 DIAGNOSIS — G8929 Other chronic pain: Secondary | ICD-10-CM | POA: Diagnosis not present

## 2023-04-24 DIAGNOSIS — H9192 Unspecified hearing loss, left ear: Secondary | ICD-10-CM | POA: Diagnosis not present

## 2023-04-25 DIAGNOSIS — H9192 Unspecified hearing loss, left ear: Secondary | ICD-10-CM | POA: Diagnosis not present

## 2023-04-25 DIAGNOSIS — E538 Deficiency of other specified B group vitamins: Secondary | ICD-10-CM | POA: Diagnosis not present

## 2023-04-25 DIAGNOSIS — F0390 Unspecified dementia without behavioral disturbance: Secondary | ICD-10-CM | POA: Diagnosis not present

## 2023-04-25 DIAGNOSIS — G8929 Other chronic pain: Secondary | ICD-10-CM | POA: Diagnosis not present

## 2023-04-25 DIAGNOSIS — M48061 Spinal stenosis, lumbar region without neurogenic claudication: Secondary | ICD-10-CM | POA: Diagnosis not present

## 2023-04-25 DIAGNOSIS — M4846XG Fatigue fracture of vertebra, lumbar region, subsequent encounter for fracture with delayed healing: Secondary | ICD-10-CM | POA: Diagnosis not present

## 2023-04-25 DIAGNOSIS — Z79891 Long term (current) use of opiate analgesic: Secondary | ICD-10-CM | POA: Diagnosis not present

## 2023-04-25 DIAGNOSIS — I10 Essential (primary) hypertension: Secondary | ICD-10-CM | POA: Diagnosis not present

## 2023-04-25 DIAGNOSIS — M5416 Radiculopathy, lumbar region: Secondary | ICD-10-CM | POA: Diagnosis not present

## 2023-04-25 DIAGNOSIS — R5383 Other fatigue: Secondary | ICD-10-CM | POA: Diagnosis not present

## 2023-04-26 DIAGNOSIS — M5416 Radiculopathy, lumbar region: Secondary | ICD-10-CM | POA: Diagnosis not present

## 2023-04-26 DIAGNOSIS — M48061 Spinal stenosis, lumbar region without neurogenic claudication: Secondary | ICD-10-CM | POA: Diagnosis not present

## 2023-04-26 DIAGNOSIS — M519 Unspecified thoracic, thoracolumbar and lumbosacral intervertebral disc disorder: Secondary | ICD-10-CM | POA: Diagnosis not present

## 2023-04-27 DIAGNOSIS — M4846XG Fatigue fracture of vertebra, lumbar region, subsequent encounter for fracture with delayed healing: Secondary | ICD-10-CM | POA: Diagnosis not present

## 2023-04-27 DIAGNOSIS — I10 Essential (primary) hypertension: Secondary | ICD-10-CM | POA: Diagnosis not present

## 2023-04-27 DIAGNOSIS — E538 Deficiency of other specified B group vitamins: Secondary | ICD-10-CM | POA: Diagnosis not present

## 2023-04-27 DIAGNOSIS — G8929 Other chronic pain: Secondary | ICD-10-CM | POA: Diagnosis not present

## 2023-04-27 DIAGNOSIS — R5383 Other fatigue: Secondary | ICD-10-CM | POA: Diagnosis not present

## 2023-04-27 DIAGNOSIS — H9192 Unspecified hearing loss, left ear: Secondary | ICD-10-CM | POA: Diagnosis not present

## 2023-04-27 DIAGNOSIS — M48061 Spinal stenosis, lumbar region without neurogenic claudication: Secondary | ICD-10-CM | POA: Diagnosis not present

## 2023-04-27 DIAGNOSIS — F0390 Unspecified dementia without behavioral disturbance: Secondary | ICD-10-CM | POA: Diagnosis not present

## 2023-04-27 DIAGNOSIS — M5416 Radiculopathy, lumbar region: Secondary | ICD-10-CM | POA: Diagnosis not present

## 2023-04-27 DIAGNOSIS — Z79891 Long term (current) use of opiate analgesic: Secondary | ICD-10-CM | POA: Diagnosis not present

## 2023-05-01 DIAGNOSIS — S32048K Other fracture of fourth lumbar vertebra, subsequent encounter for fracture with nonunion: Secondary | ICD-10-CM | POA: Diagnosis not present

## 2023-05-01 DIAGNOSIS — R2681 Unsteadiness on feet: Secondary | ICD-10-CM | POA: Diagnosis not present

## 2023-05-04 DIAGNOSIS — R2681 Unsteadiness on feet: Secondary | ICD-10-CM | POA: Diagnosis not present

## 2023-05-04 DIAGNOSIS — S32048K Other fracture of fourth lumbar vertebra, subsequent encounter for fracture with nonunion: Secondary | ICD-10-CM | POA: Diagnosis not present

## 2023-05-08 DIAGNOSIS — S32048K Other fracture of fourth lumbar vertebra, subsequent encounter for fracture with nonunion: Secondary | ICD-10-CM | POA: Diagnosis not present

## 2023-05-08 DIAGNOSIS — R2681 Unsteadiness on feet: Secondary | ICD-10-CM | POA: Diagnosis not present

## 2023-05-10 DIAGNOSIS — S32048K Other fracture of fourth lumbar vertebra, subsequent encounter for fracture with nonunion: Secondary | ICD-10-CM | POA: Diagnosis not present

## 2023-05-10 DIAGNOSIS — R2681 Unsteadiness on feet: Secondary | ICD-10-CM | POA: Diagnosis not present

## 2023-05-15 DIAGNOSIS — S32048K Other fracture of fourth lumbar vertebra, subsequent encounter for fracture with nonunion: Secondary | ICD-10-CM | POA: Diagnosis not present

## 2023-05-15 DIAGNOSIS — R2681 Unsteadiness on feet: Secondary | ICD-10-CM | POA: Diagnosis not present

## 2023-08-30 ENCOUNTER — Ambulatory Visit: Payer: Self-pay

## 2023-08-30 ENCOUNTER — Institutional Professional Consult (permissible substitution): Payer: Medicare HMO | Admitting: Psychology

## 2023-09-06 ENCOUNTER — Encounter: Payer: Medicare HMO | Admitting: Psychology

## 2023-10-24 IMAGING — CT CT HEAD W/O CM
4 series · 17 of 47 positions shown, 19 images · non-contrast
Comparison: None.

CLINICAL DATA: Fall.  Head trauma, minor (Age >= 65y)

EXAM:
CT HEAD WITHOUT CONTRAST
TECHNIQUE: Contiguous axial images were obtained from the base of the skull
through the vertex without intravenous contrast.

[Series 2: head wo · axial · 0.46mm/px · z∈[-27,+93]mm · 7 of 33 slices shown, 9 images]
[im 5/33  brain]
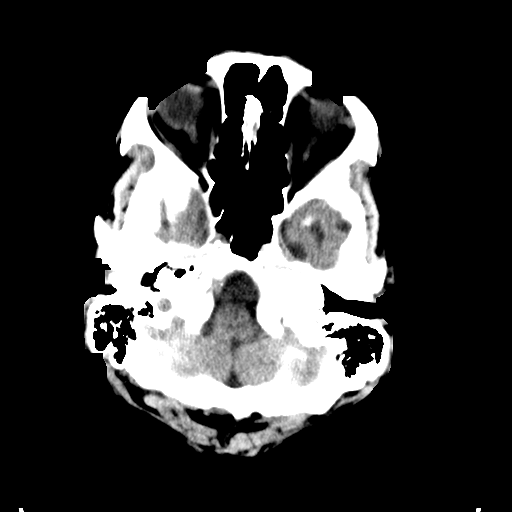
[im 5/33  bone]
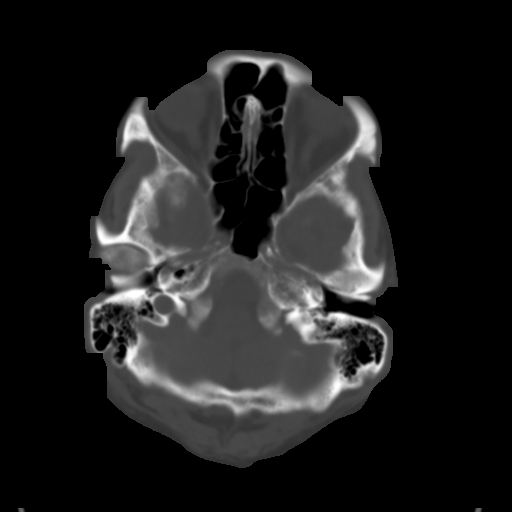
[im 9/33  brain]
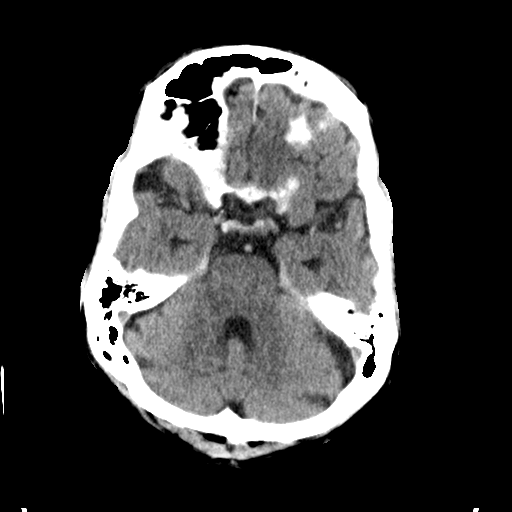
[im 13/33  brain]
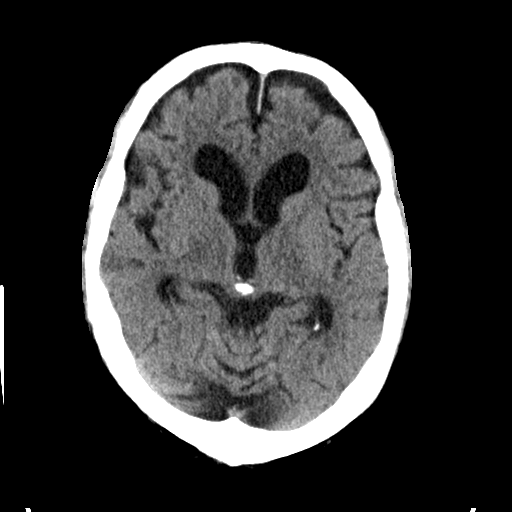
[im 17/33  brain]
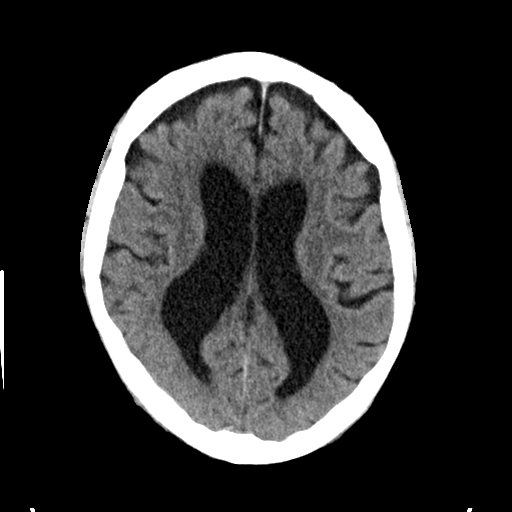
[im 21/33  brain]
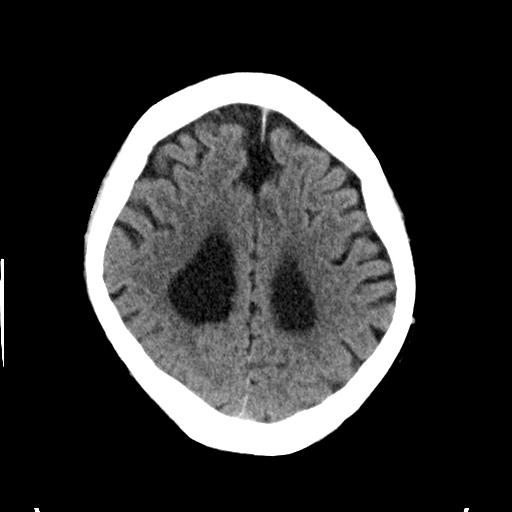
[im 21/33  bone]
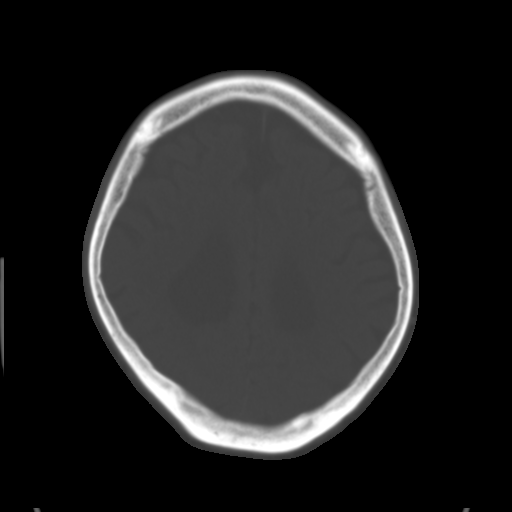
[im 25/33  brain]
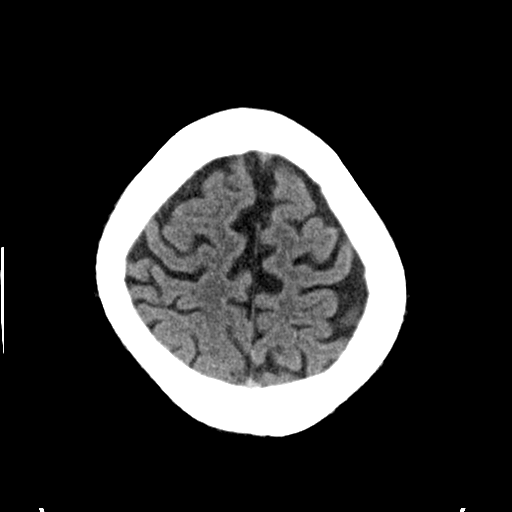
[im 29/33  brain]
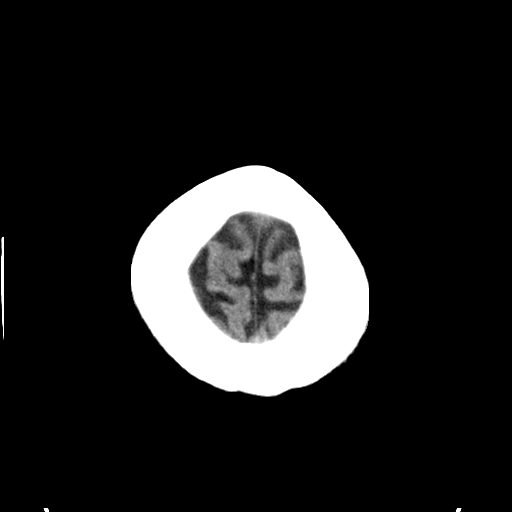

[Series 3: head bone · axial · 0.46mm/px · z∈[-31,+25]mm · 4 of 83 slices shown]
[im 9/83  bone]
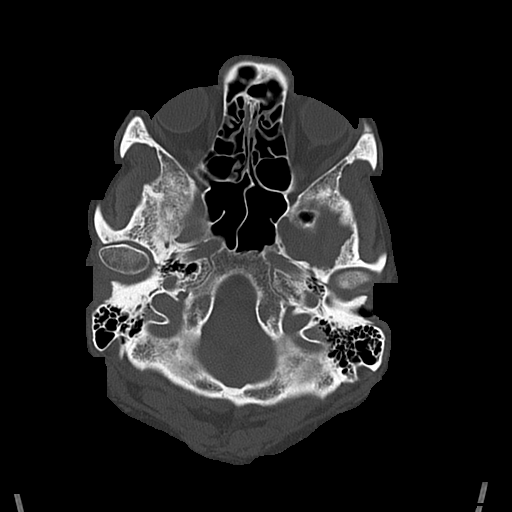
[im 17/83  bone]
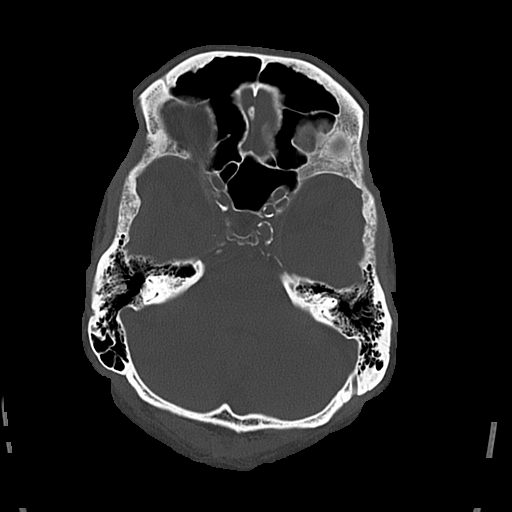
[im 25/83  bone]
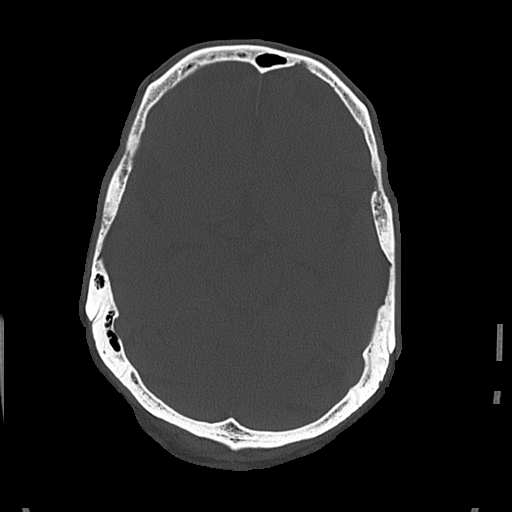
[im 37/83  bone]
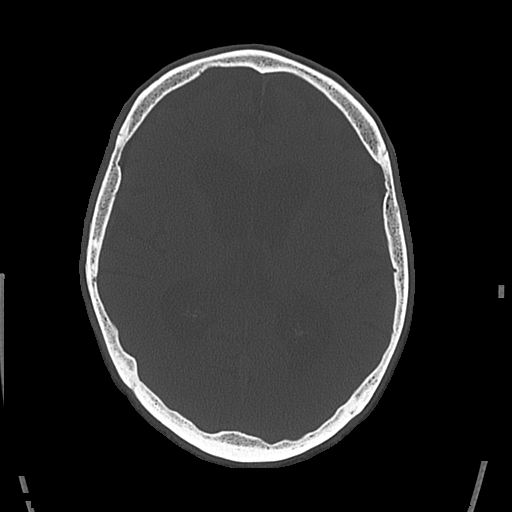

[Series 4: coronal soft · coronal · 0.35mm/px · 3 of 69 slices shown]
[im 23/69  brain]
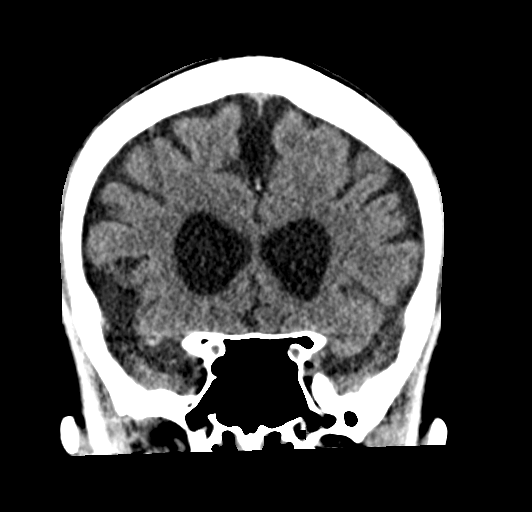
[im 31/69  brain]
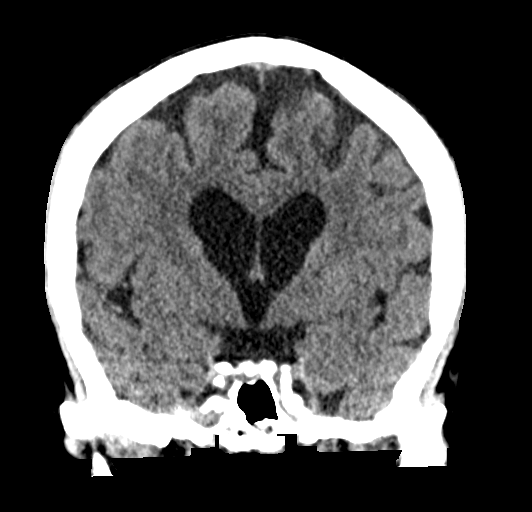
[im 38/69  brain]
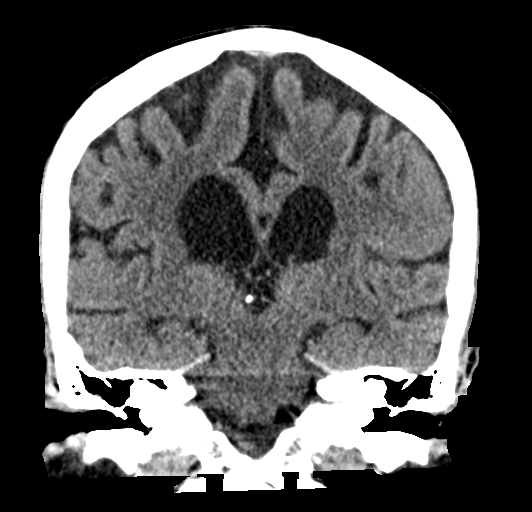

[Series 5: sagittal soft · sagittal · 0.35mm/px · 3 of 62 slices shown]
[im 21/62  brain]
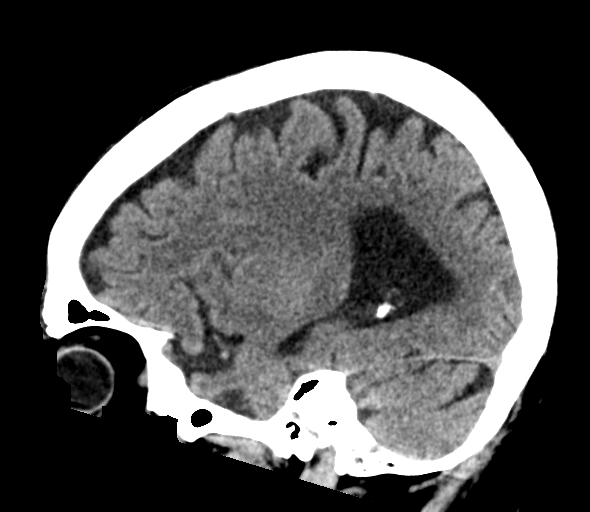
[im 31/62  brain]
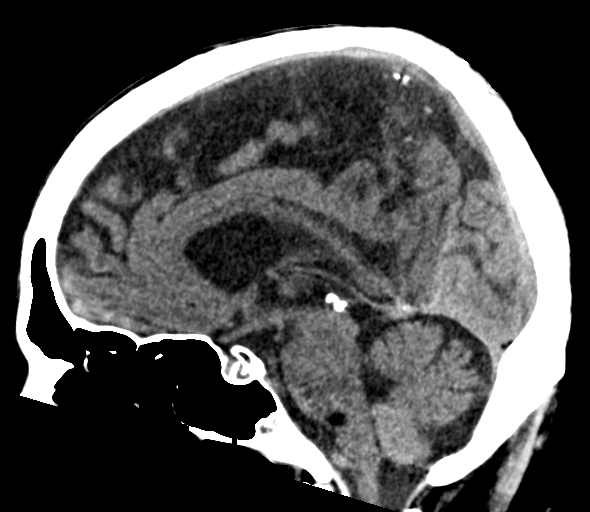
[im 41/62  brain]
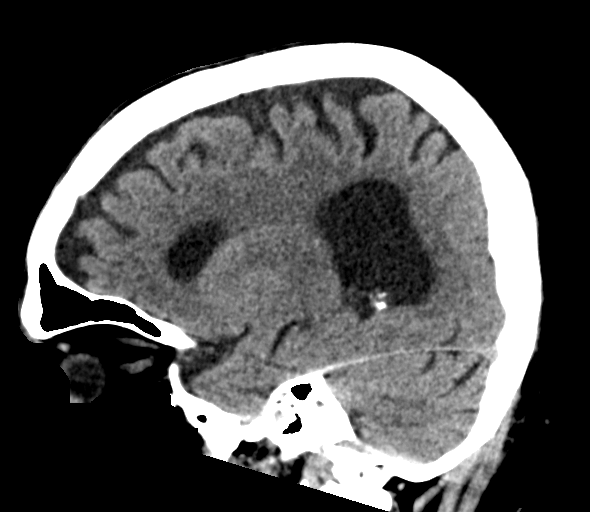

[17 of 47 positions shown; findings below may reference images not displayed]

FINDINGS: Brain: No evidence of acute infarction, hemorrhage, hydrocephalus,
or extra-axial collection. There is a rounded fat attenuation mass
near the cerebellopontine angle on the left measuring 13 x 9 x 11 mm
(series 2, image 6). Scattered low-density changes within the
periventricular and subcortical white matter compatible with chronic
microvascular ischemic change. Mild diffuse cerebral volume loss.

Vascular: Atherosclerotic calcifications involving the large vessels
of the skull base. No unexpected hyperdense vessel.

Skull: Normal. Negative for fracture or focal lesion.

Sinuses/Orbits: No acute finding.

Other: None.
IMPRESSION: 1. No acute intracranial findings.
2. Chronic microvascular ischemic change and cerebral volume loss.
3. Incidental note of a 13 mm fat attenuation mass near the
cerebellopontine angle on the left, most compatible with a lipoma.
Comparison with prior outside imaging of the brain is recommended to
establish stability of this finding.

## 2023-10-24 IMAGING — DX DG ELBOW COMPLETE 3+V*R*
4 series · 4 of 4 positions shown · non-contrast
Comparison: None.

CLINICAL DATA: Fall, right elbow pain

EXAM:
RIGHT ELBOW - COMPLETE 3+ VIEW

[elbow ap]
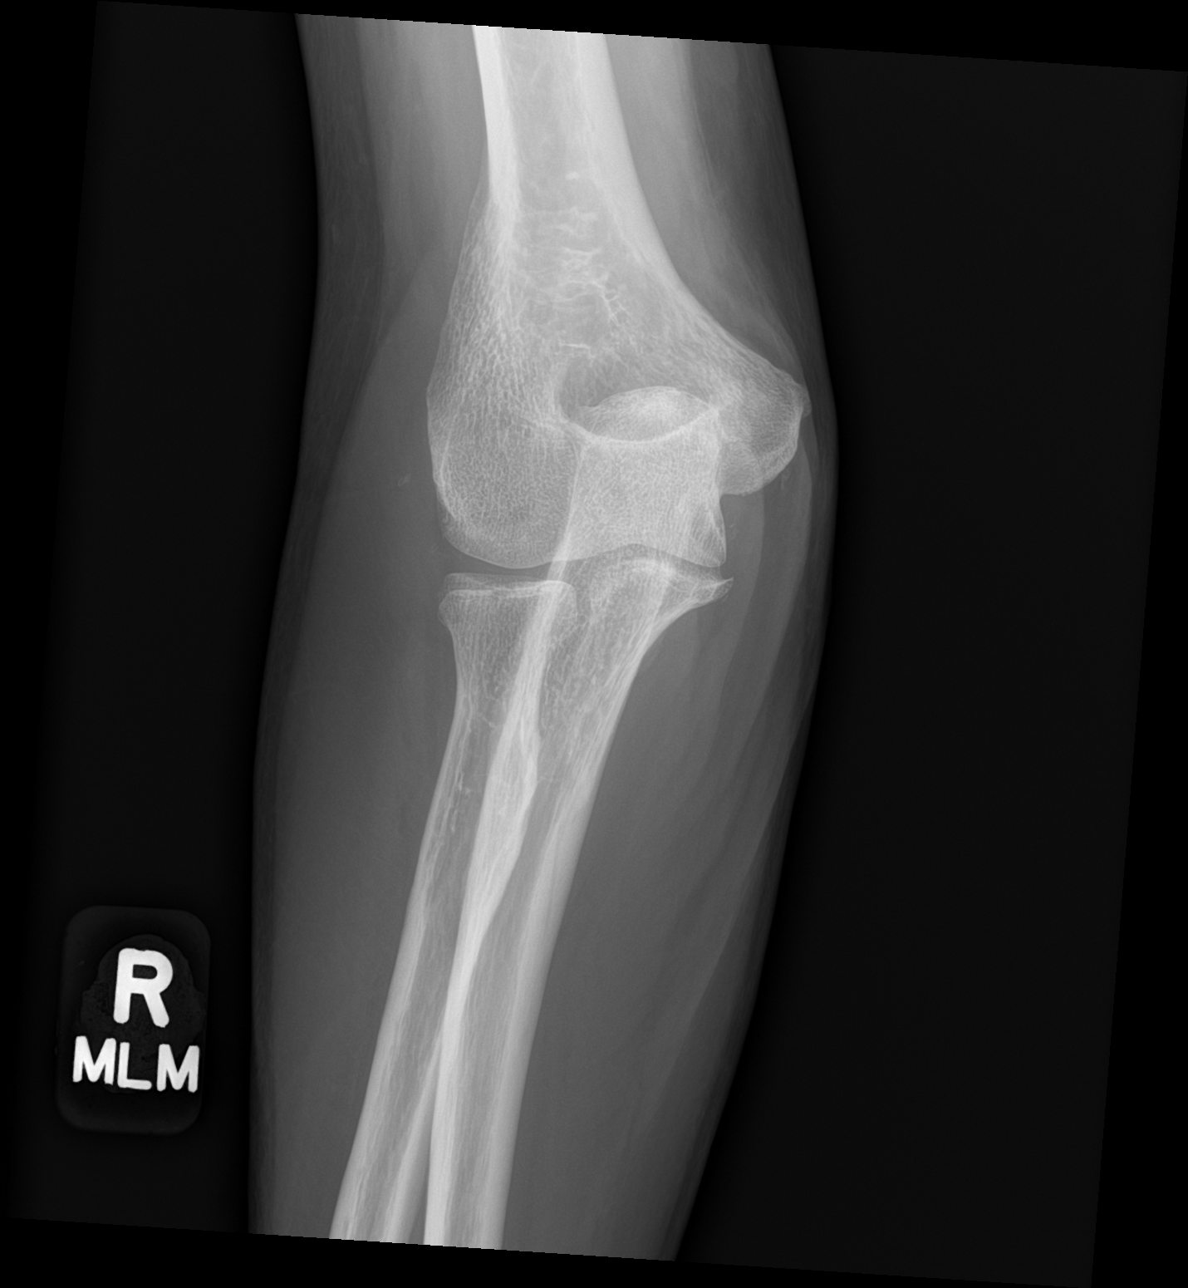

[elbow obl (1 of 2)]
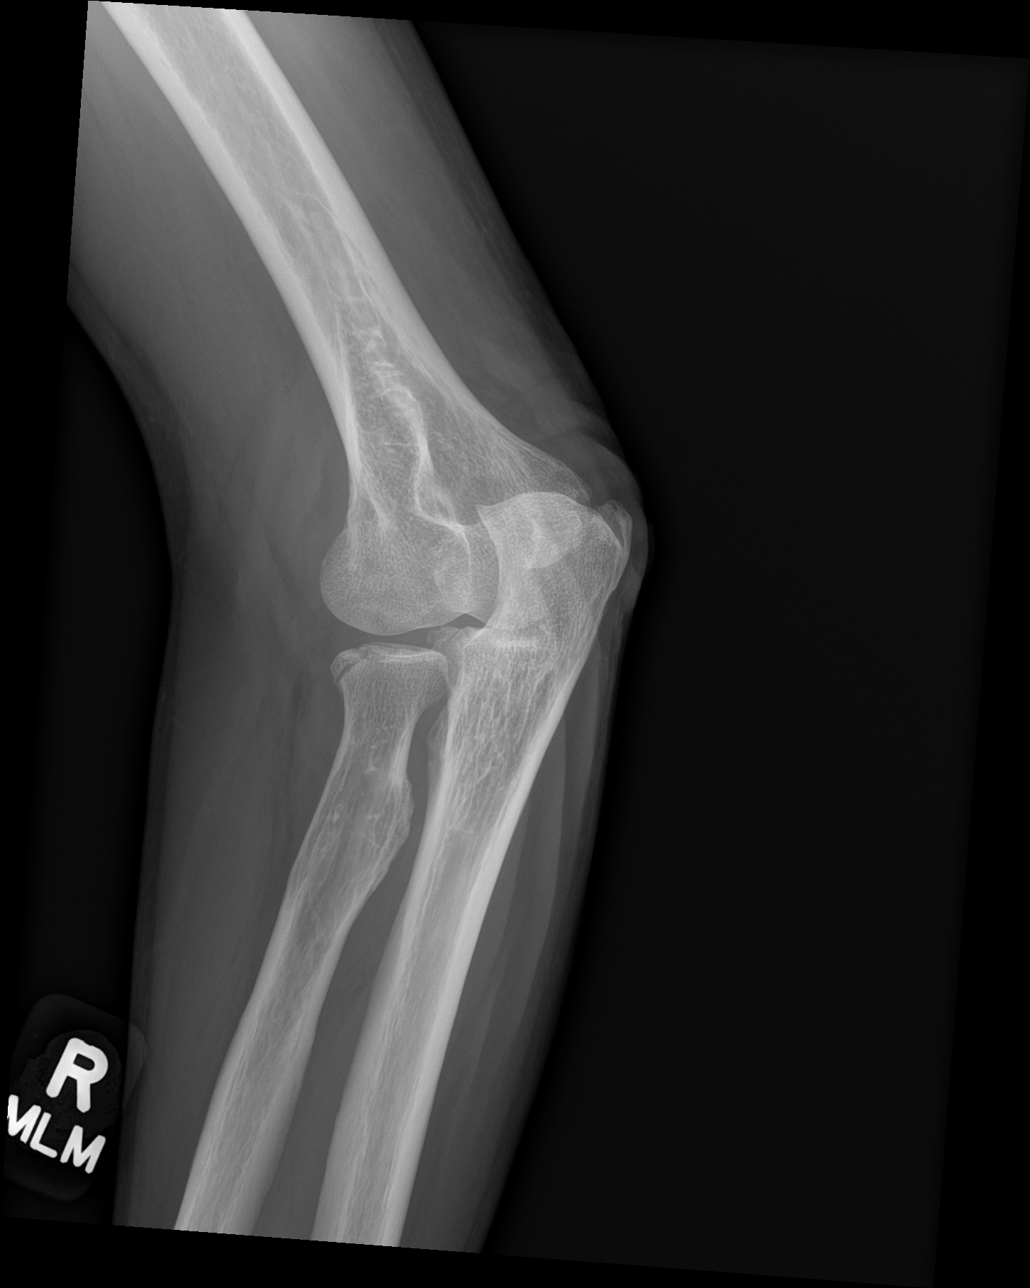

[elbow obl (2 of 2)]
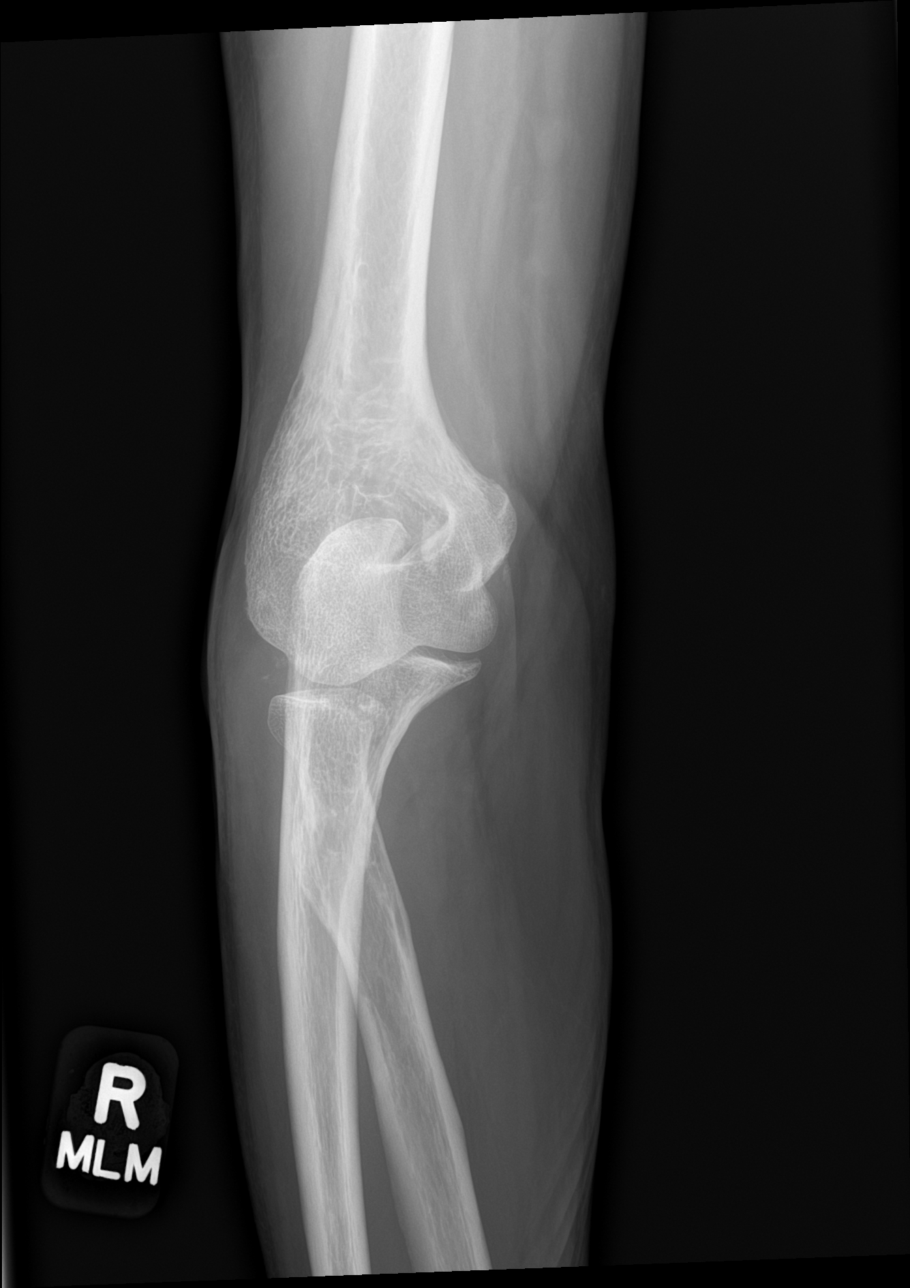

[elbow lat]
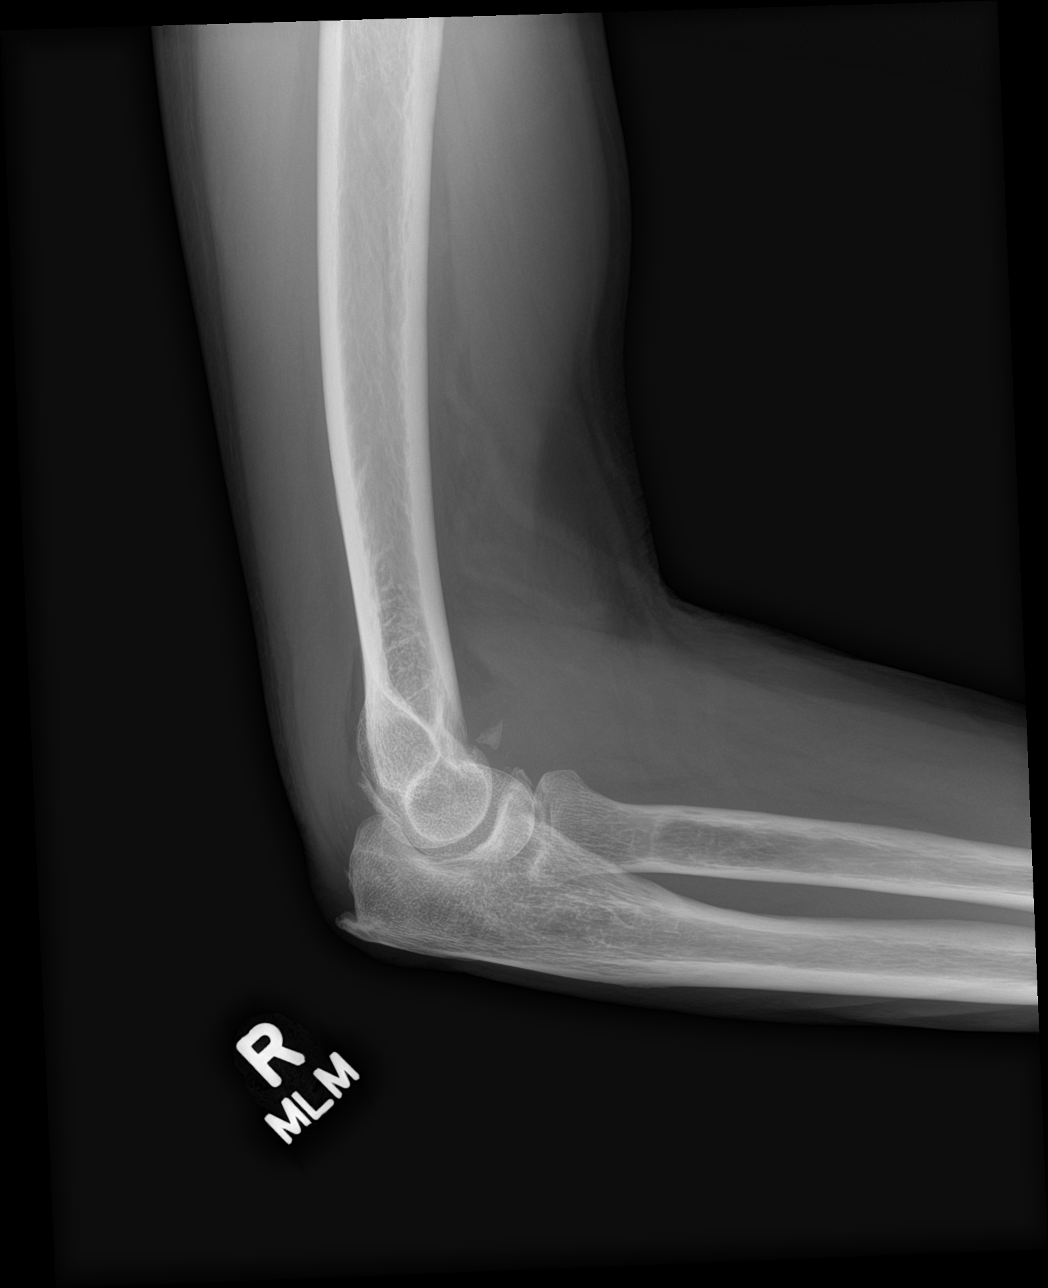

[4 of 4 positions shown; findings below may reference images not displayed]

FINDINGS: Acute minimally depressed fracture of the radial head and neck. 6 mm
triangular osseous density projects within the anterior joint space.
Exact donor site is uncertain, but may be related to the coronoid
process. Evaluation is slightly limited by patient rotation on
lateral view. There is a large elbow joint hemarthrosis. Osseous
alignment is maintained without dislocation.
IMPRESSION: 1. Acute minimally depressed fracture of the radial head and neck.
2. 6 mm triangular osseous density within the anterior joint space,
possibly related to the coronoid process.
3. Large elbow joint hemarthrosis.

## 2024-01-15 ENCOUNTER — Encounter: Payer: Self-pay | Admitting: Radiology
